# Patient Record
Sex: Female | Born: 1945
Health system: Southern US, Community
[De-identification: ages and names within clinical notes are randomized; demographics above are authoritative.]

## PROBLEM LIST (undated history)

## (undated) DIAGNOSIS — K317 Polyp of stomach and duodenum: Secondary | ICD-10-CM

## (undated) DIAGNOSIS — K5790 Diverticulosis of intestine, part unspecified, without perforation or abscess without bleeding: Secondary | ICD-10-CM

## (undated) DIAGNOSIS — J45909 Unspecified asthma, uncomplicated: Secondary | ICD-10-CM

## (undated) DIAGNOSIS — K222 Esophageal obstruction: Secondary | ICD-10-CM

## (undated) DIAGNOSIS — K219 Gastro-esophageal reflux disease without esophagitis: Secondary | ICD-10-CM

## (undated) DIAGNOSIS — K449 Diaphragmatic hernia without obstruction or gangrene: Secondary | ICD-10-CM

## (undated) HISTORY — DX: Unspecified asthma, uncomplicated: J45.909

## (undated) HISTORY — PX: LEFT OOPHORECTOMY: SHX1961

## (undated) HISTORY — DX: Diaphragmatic hernia without obstruction or gangrene: K44.9

## (undated) HISTORY — DX: Esophageal obstruction: K22.2

## (undated) HISTORY — DX: Polyp of stomach and duodenum: K31.7

## (undated) HISTORY — DX: Gastro-esophageal reflux disease without esophagitis: K21.9

## (undated) HISTORY — PX: APPENDECTOMY: SHX54

## (undated) HISTORY — PX: TUBAL LIGATION: SHX77

## (undated) HISTORY — PX: CHOLECYSTECTOMY: SHX55

## (undated) HISTORY — DX: Diverticulosis of intestine, part unspecified, without perforation or abscess without bleeding: K57.90

---

## 1998-02-25 ENCOUNTER — Ambulatory Visit (HOSPITAL_COMMUNITY): Admission: RE | Admit: 1998-02-25 | Discharge: 1998-02-25 | Payer: Self-pay | Admitting: *Deleted

## 1999-03-17 ENCOUNTER — Encounter: Payer: Self-pay | Admitting: *Deleted

## 1999-03-17 ENCOUNTER — Ambulatory Visit (HOSPITAL_COMMUNITY): Admission: RE | Admit: 1999-03-17 | Discharge: 1999-03-17 | Payer: Self-pay | Admitting: *Deleted

## 2001-03-06 ENCOUNTER — Encounter: Payer: Self-pay | Admitting: Internal Medicine

## 2001-04-09 ENCOUNTER — Encounter: Payer: Self-pay | Admitting: Internal Medicine

## 2001-10-08 ENCOUNTER — Encounter: Payer: Self-pay | Admitting: *Deleted

## 2001-10-08 ENCOUNTER — Ambulatory Visit (HOSPITAL_COMMUNITY): Admission: RE | Admit: 2001-10-08 | Discharge: 2001-10-08 | Payer: Self-pay | Admitting: *Deleted

## 2003-02-03 ENCOUNTER — Encounter: Payer: Self-pay | Admitting: *Deleted

## 2003-02-03 ENCOUNTER — Ambulatory Visit (HOSPITAL_COMMUNITY): Admission: RE | Admit: 2003-02-03 | Discharge: 2003-02-03 | Payer: Self-pay | Admitting: *Deleted

## 2005-05-17 ENCOUNTER — Ambulatory Visit: Payer: Self-pay | Admitting: Internal Medicine

## 2006-03-07 ENCOUNTER — Ambulatory Visit (HOSPITAL_COMMUNITY): Admission: RE | Admit: 2006-03-07 | Discharge: 2006-03-07 | Payer: Self-pay | Admitting: Internal Medicine

## 2006-09-27 ENCOUNTER — Ambulatory Visit: Payer: Self-pay | Admitting: Internal Medicine

## 2007-03-22 ENCOUNTER — Ambulatory Visit (HOSPITAL_COMMUNITY): Admission: RE | Admit: 2007-03-22 | Discharge: 2007-03-22 | Payer: Self-pay | Admitting: Internal Medicine

## 2008-03-24 ENCOUNTER — Ambulatory Visit (HOSPITAL_COMMUNITY): Admission: RE | Admit: 2008-03-24 | Discharge: 2008-03-24 | Payer: Self-pay | Admitting: Internal Medicine

## 2008-04-28 DIAGNOSIS — K449 Diaphragmatic hernia without obstruction or gangrene: Secondary | ICD-10-CM | POA: Insufficient documentation

## 2008-04-28 DIAGNOSIS — K219 Gastro-esophageal reflux disease without esophagitis: Secondary | ICD-10-CM | POA: Insufficient documentation

## 2008-04-28 DIAGNOSIS — K222 Esophageal obstruction: Secondary | ICD-10-CM | POA: Insufficient documentation

## 2008-04-28 DIAGNOSIS — K5730 Diverticulosis of large intestine without perforation or abscess without bleeding: Secondary | ICD-10-CM | POA: Insufficient documentation

## 2008-04-30 ENCOUNTER — Ambulatory Visit: Payer: Self-pay | Admitting: Internal Medicine

## 2008-05-13 ENCOUNTER — Telehealth: Payer: Self-pay | Admitting: Internal Medicine

## 2008-05-19 ENCOUNTER — Telehealth: Payer: Self-pay | Admitting: Internal Medicine

## 2008-05-20 ENCOUNTER — Encounter: Payer: Self-pay | Admitting: Internal Medicine

## 2009-03-26 ENCOUNTER — Ambulatory Visit (HOSPITAL_COMMUNITY): Admission: RE | Admit: 2009-03-26 | Discharge: 2009-03-26 | Payer: Self-pay | Admitting: Internal Medicine

## 2009-06-01 ENCOUNTER — Encounter: Payer: Self-pay | Admitting: Internal Medicine

## 2010-03-30 ENCOUNTER — Ambulatory Visit (HOSPITAL_COMMUNITY): Admission: RE | Admit: 2010-03-30 | Discharge: 2010-03-30 | Payer: Self-pay | Admitting: Internal Medicine

## 2010-10-27 ENCOUNTER — Other Ambulatory Visit: Payer: Self-pay | Admitting: Internal Medicine

## 2010-10-27 NOTE — Telephone Encounter (Signed)
See previous note

## 2010-10-27 NOTE — Telephone Encounter (Signed)
Called patient and she has made an appt. For April 19th at 11:15 to see Dr. Marina Goodell.  I will give her #30 until she comes in and she can then get a years worth at that time.

## 2010-11-11 ENCOUNTER — Encounter: Payer: Self-pay | Admitting: Internal Medicine

## 2010-11-11 ENCOUNTER — Ambulatory Visit (INDEPENDENT_AMBULATORY_CARE_PROVIDER_SITE_OTHER): Payer: Medicare Other | Admitting: Internal Medicine

## 2010-11-11 VITALS — BP 136/80 | HR 64 | Ht 61.0 in | Wt 163.4 lb

## 2010-11-11 DIAGNOSIS — K219 Gastro-esophageal reflux disease without esophagitis: Secondary | ICD-10-CM

## 2010-11-11 DIAGNOSIS — Z1211 Encounter for screening for malignant neoplasm of colon: Secondary | ICD-10-CM

## 2010-11-11 NOTE — Patient Instructions (Signed)
Follow-up with Dr. Marina Goodell 2 years Change recall colon to August 2012

## 2010-11-11 NOTE — Progress Notes (Signed)
HISTORY OF PRESENT ILLNESS:  Michelle Lane is a 65 y.o. female with the below listed medical history who is followed in this office for gastroesophageal reflux disease complicated by esophagitis and peptic stricture. She was last seen in October 2009 at which time she was doing well on PPI therapy. She previously indicated that her father had colon cancer for which the patient underwent screening colonoscopy in 2002. The exam was negative. She subsequently corrected the history stating that her father did not have colon cancer, but rather prostate cancer. In terms of GERD, she continues on omeprazole 40 mg daily. She is tolerating the medication well without apparent side effects. She denies breakthrough pyrosis or recurrent dysphagia. No lower GI complaints.  REVIEW OF SYSTEMS:  All non-GI ROS negative except for seasonal allergies.  Past Medical History  Diagnosis Date  . Diverticulosis   . Esophageal stricture   . Diaphragmatic hernia without mention of obstruction or gangrene   . Esophageal reflux     Past Surgical History  Procedure Date  . Appendectomy   . Cholecystectomy   . Tubal ligation   . Left oophorectomy     Social History DENALY GATLING  reports that she has quit smoking. She does not have any smokeless tobacco history on file. She reports that she drinks alcohol. She reports that she does not use illicit drugs.  family history includes Breast cancer in her mother; Diabetes in her brother; Heart disease in her brother; Kidney disease in her brother; and Prostate cancer in her father.  There is no history of Colon cancer.  No Known Allergies     PHYSICAL EXAMINATION: Vital signs: BP 136/80  Pulse 64  Ht 5\' 1"  (1.549 m)  Wt 163 lb 6.4 oz (74.118 kg)  BMI 30.87 kg/m2 General: Well-developed, well-nourished, no acute distress HEENT: Sclerae are anicteric, conjunctiva pink. Oral mucosa intact Lungs: Clear Heart: Regular Abdomen: soft, nontender, nondistended, no  obvious ascites, no peritoneal signs, normal bowel sounds. No organomegaly. Extremities: No edema Psychiatric: alert and oriented x3. Cooperative    ASSESSMENT:  #1. GERD complicated by erosive esophagitis and peptic stricture. The patient remains asymptomatic on PPI therapy. I discussed her issue surrounding long term PPI use. We focused on bone density issues. She tells me that she has had bone density testing which is normal.  #2. Colorectal neoplasia screening. Negative colonoscopy 2002. Up-to-date. Will be due for a repeat screening around August 2012. She is aware. Recall made.   PLAN:  #1. Refill omeprazole 40 mg daily #2. Reflux precautions #3. Screening colonoscopy later this year. #4. Routine office followup in 2 years. Sooner for questions or problems.

## 2010-12-10 NOTE — Assessment & Plan Note (Signed)
[facility]                         GASTROENTEROLOGY OFFICE NOTE   NAME:Jumon[place], NALDA SHACKLEFORD                        MRN:          [id number]  DATE:09/27/2006                            DOB:          Dec 02, 1945    HISTORY:  Afifa presents today for followup.  She is a 65 year old with  a history of gastroesophageal reflux disease, complicated by peptic  stricture.  She also has a family history of colon cancer with her index  screening colonoscopy in August of 2002 showing diverticulosis only.  She presents now for routine followup.  Her last visit was May 17, 2005.  See that dictation for details.  Since that time she remains  asymptomatic on Nexium 40 mg daily.  No problems with heartburn,  indigestion, or dysphagia.  She is aware that she is somewhat overdue  for her followup screening colonoscopy.   CURRENT MEDICATIONS:  Nexium, vitamin E, and Macrobid.   PHYSICAL EXAMINATION:  Finds a well-appearing female in no acute  distress.  Blood pressure is 108/70, heart rate is 80 and regular,  weight is 136 pounds.  ABDOMEN:  Soft without tenderness, mass, or hernia.  Good bowel sounds  heard.   IMPRESSION:  1. Gastroesophageal reflux disease, complicated by erosive esophagitis      and peptic stricture.  Currently asymptomatic on Nexium.  2. Family history of colon cancer in a first degree relative with      negative screening colonoscopy in August of 2002.  Due for followup      screening.  3. Incidental diverticulosis.   RECOMMENDATIONS:  1. Continue Nexium 40 mg daily.  A prescription with multiple refills      has been provided.  2. Continue adherence to reflux precautions.  3. Screening colonoscopy, postponed until this summer per the      patient's wishes.  I have provided her with my professional card      and my nurse's name and phone number to assist her with scheduling.     Wilhemina Bonito. Marina Goodell, MD  Electronically Signed    JNP/MedQ  DD:  09/27/2006  DT: 09/27/2006  Job #: [id number]   cc:   Geoffry Paradise, M.D.

## 2011-03-01 ENCOUNTER — Other Ambulatory Visit (HOSPITAL_COMMUNITY): Payer: Self-pay | Admitting: Internal Medicine

## 2011-03-01 DIAGNOSIS — Z1231 Encounter for screening mammogram for malignant neoplasm of breast: Secondary | ICD-10-CM

## 2011-04-04 ENCOUNTER — Ambulatory Visit (HOSPITAL_COMMUNITY)
Admission: RE | Admit: 2011-04-04 | Discharge: 2011-04-04 | Disposition: A | Discharge status: Home / Self Care | Payer: Medicare Other | Source: Ambulatory Visit | Attending: Internal Medicine | Admitting: Internal Medicine

## 2011-04-04 DIAGNOSIS — Z1231 Encounter for screening mammogram for malignant neoplasm of breast: Secondary | ICD-10-CM

## 2011-04-23 ENCOUNTER — Other Ambulatory Visit: Payer: Self-pay | Admitting: Internal Medicine

## 2011-10-08 ENCOUNTER — Other Ambulatory Visit: Payer: Self-pay | Admitting: Internal Medicine

## 2011-10-12 ENCOUNTER — Other Ambulatory Visit: Payer: Self-pay

## 2011-10-14 ENCOUNTER — Telehealth: Payer: Self-pay

## 2011-10-14 NOTE — Telephone Encounter (Signed)
Pharmacist did not receive rx for Omeprazole when sent electronically; gave pharmacist info over phone for refill

## 2012-02-28 ENCOUNTER — Other Ambulatory Visit (HOSPITAL_COMMUNITY): Payer: Self-pay | Admitting: Internal Medicine

## 2012-02-28 DIAGNOSIS — Z1231 Encounter for screening mammogram for malignant neoplasm of breast: Secondary | ICD-10-CM

## 2012-04-04 ENCOUNTER — Ambulatory Visit (HOSPITAL_COMMUNITY): Payer: Medicare Other

## 2012-04-18 ENCOUNTER — Ambulatory Visit (HOSPITAL_COMMUNITY)
Admission: RE | Admit: 2012-04-18 | Discharge: 2012-04-18 | Disposition: A | Discharge status: Home / Self Care | Payer: Medicare Other | Source: Ambulatory Visit | Attending: Internal Medicine | Admitting: Internal Medicine

## 2012-04-18 DIAGNOSIS — Z1231 Encounter for screening mammogram for malignant neoplasm of breast: Secondary | ICD-10-CM | POA: Insufficient documentation

## 2012-11-05 ENCOUNTER — Other Ambulatory Visit: Payer: Self-pay | Admitting: Internal Medicine

## 2013-02-19 ENCOUNTER — Other Ambulatory Visit: Payer: Self-pay | Admitting: Internal Medicine

## 2013-04-30 ENCOUNTER — Other Ambulatory Visit (HOSPITAL_COMMUNITY): Payer: Self-pay | Admitting: Internal Medicine

## 2013-04-30 DIAGNOSIS — Z1231 Encounter for screening mammogram for malignant neoplasm of breast: Secondary | ICD-10-CM

## 2013-05-10 ENCOUNTER — Ambulatory Visit (HOSPITAL_COMMUNITY)
Admission: RE | Admit: 2013-05-10 | Discharge: 2013-05-10 | Disposition: A | Discharge status: Home / Self Care | Payer: Medicare Other | Source: Ambulatory Visit | Attending: Internal Medicine | Admitting: Internal Medicine

## 2013-05-10 DIAGNOSIS — Z1231 Encounter for screening mammogram for malignant neoplasm of breast: Secondary | ICD-10-CM

## 2013-07-09 ENCOUNTER — Ambulatory Visit (INDEPENDENT_AMBULATORY_CARE_PROVIDER_SITE_OTHER): Payer: Medicare Other | Admitting: Gastroenterology

## 2013-07-09 ENCOUNTER — Encounter: Payer: Self-pay | Admitting: Gastroenterology

## 2013-07-09 VITALS — BP 128/80 | HR 72 | Ht 60.0 in | Wt 129.3 lb

## 2013-07-09 DIAGNOSIS — Z791 Long term (current) use of non-steroidal anti-inflammatories (NSAID): Secondary | ICD-10-CM | POA: Insufficient documentation

## 2013-07-09 DIAGNOSIS — K5900 Constipation, unspecified: Secondary | ICD-10-CM

## 2013-07-09 DIAGNOSIS — K219 Gastro-esophageal reflux disease without esophagitis: Secondary | ICD-10-CM

## 2013-07-09 DIAGNOSIS — K222 Esophageal obstruction: Secondary | ICD-10-CM

## 2013-07-09 DIAGNOSIS — R110 Nausea: Secondary | ICD-10-CM

## 2013-07-09 DIAGNOSIS — R6881 Early satiety: Secondary | ICD-10-CM

## 2013-07-09 MED ORDER — OMEPRAZOLE 40 MG PO CPDR: 40.0000 mg | DELAYED_RELEASE_CAPSULE | Freq: Two times a day (BID) | ORAL | Status: DC

## 2013-07-09 NOTE — Progress Notes (Signed)
07/09/2013 Michelle Lane [id number] 14-Jan-1946   History of Present Illness:  This is a pleasant 67 year old female who is known to Dr. Marina Goodell for her history of reflux issues. She was last seen in April 2012 at which time her reflux seemed to be well controlled on daily PPI therapy. She presents to our office today stating that for quite some time now she has been having extreme nausea and feels very full even after eating small amounts. She states that she only eats one meal per day and often gets very sick after eating. She says that sometimes it even seems like she gets chills and other flulike symptoms after eating. She cannot identify any particular foods that cause her symptoms. She takes omeprazole 40 mg daily and says that it controls her heartburn and reflux symptoms. She is concerned that she has ulcers or gastric cancer. She was using a lot of ibuprofen approximately 8-12 per day for several years until visiting her PCP in October when they told her to discontinue this. She uses Tylenol now and has only taken about 12 ibuprofen total since October. She had an EGD in  2002 at which time she was found to have reflux esophagitis, hiatal hernia, esophageal stricture, GERD, and duodenitis.    She also complains of some constipation.  She has taken a laxative on occasion but says that makes her stomach hurt and makes her feel sick. Otherwise she has not used anything to help her move her bowels. Her last colonoscopy was also in 2002 and she was advised that she was due in 2012. She told me that she did not want to have another colonoscopy at this time and was not having any other lower GI symptoms or complaints.  Recent CBC, BMP, and TSH by her PCP were normal.   Current Medications, Allergies, Past Medical History, Past Surgical History, Family History and Social History were reviewed in [employer] record.   Physical Exam: BP 128/80  Pulse 72  Ht 5' (1.524 m)  Wt 129 lb  4.8 oz (58.65 kg)  BMI 25.25 kg/m2 General: Well developed, white female in no acute distress Head: Normocephalic and atraumatic Eyes:  Sclerae anicteric, conjunctiva pink  Ears: Normal auditory acuity Lungs: Clear throughout to auscultation Heart: Regular rate and rhythm Abdomen: Soft, non-distended.  Normal bowel sounds.  Non-tender. Musculoskeletal: Symmetrical with no gross deformities  Extremities: No edema  Neurological: Alert oriented x 4, grossly nonfocal Psychological:  Alert and cooperative. Normal mood and affect  Assessment and Recommendations: -Nausea and early satiety:  Reflux disease versus ulcer disease versus gastroparesis. -History of NSAID use:  He was taking 8-12 ibuprofen daily for several years until October. Has only taken 12 pills total since October. -History of GERD and esophageal stricture:  Seen on EGD in 2002. -Constipation  *We will schedule for EGD for further evaluation.  In the meantime she will increase her omeprazole to 40 mg twice daily. If EGD is negative then may want to consider gastric emptying scan. *She will begin taking MiraLax once daily. *She was due for screening colonoscopy in 2012 and I did offer to schedule that for her today as well, however, she declined and stated that she did not want to have another colonoscopy at this time.  The risks, benefits, and alternatives were discussed with the patient and she consents to proceed.

## 2013-07-09 NOTE — Patient Instructions (Signed)
Increase the Omeprazole 40 mg to twice daily.  You have been scheduled for an endoscopy with propofol. Please follow written instructions given to you at your visit today. If you use inhalers (even only as needed), please bring them with you on the day of your procedure.

## 2013-07-09 NOTE — Progress Notes (Signed)
Agree with initial assessment and plans. EGD to evaluate upper GI symptoms. Overdue for colonoscopy. Family history of colon cancer.

## 2013-07-11 ENCOUNTER — Ambulatory Visit (AMBULATORY_SURGERY_CENTER): Payer: Medicare Other | Admitting: Internal Medicine

## 2013-07-11 ENCOUNTER — Telehealth: Payer: Self-pay

## 2013-07-11 ENCOUNTER — Encounter: Payer: Self-pay | Admitting: Internal Medicine

## 2013-07-11 ENCOUNTER — Other Ambulatory Visit: Payer: Self-pay

## 2013-07-11 VITALS — BP 127/55 | HR 63 | Temp 95.9°F | Resp 15 | Ht 60.0 in | Wt 129.0 lb

## 2013-07-11 DIAGNOSIS — R6881 Early satiety: Secondary | ICD-10-CM

## 2013-07-11 DIAGNOSIS — R109 Unspecified abdominal pain: Secondary | ICD-10-CM

## 2013-07-11 DIAGNOSIS — K219 Gastro-esophageal reflux disease without esophagitis: Secondary | ICD-10-CM

## 2013-07-11 DIAGNOSIS — R110 Nausea: Secondary | ICD-10-CM

## 2013-07-11 MED ORDER — SODIUM CHLORIDE 0.9 % IV SOLN: 500.0000 mL | INTRAVENOUS | Status: DC

## 2013-07-11 NOTE — Patient Instructions (Signed)
Discharge instructions given with verbal understanding. Normal exam. Resume previous medications. YOU HAD AN ENDOSCOPIC PROCEDURE TODAY AT THE [facility]: Refer to the procedure report that was given to you for any specific questions about what was found during the examination.  If the procedure report does not answer your questions, please call your gastroenterologist to clarify.  If you requested that your care partner not be given the details of your procedure findings, then the procedure report has been included in a sealed envelope for you to review at your convenience later.  YOU SHOULD EXPECT: Some feelings of bloating in the abdomen. Passage of more gas than usual.  Walking can help get rid of the air that was put into your GI tract during the procedure and reduce the bloating. If you had a lower endoscopy (such as a colonoscopy or flexible sigmoidoscopy) you may notice spotting of blood in your stool or on the toilet paper. If you underwent a bowel prep for your procedure, then you may not have a normal bowel movement for a few days.  DIET: Your first meal following the procedure should be a light meal and then it is ok to progress to your normal diet.  A half-sandwich or bowl of soup is an example of a good first meal.  Heavy or fried foods are harder to digest and may make you feel nauseous or bloated.  Likewise meals heavy in dairy and vegetables can cause extra gas to form and this can also increase the bloating.  Drink plenty of fluids but you should avoid alcoholic beverages for 24 hours.  ACTIVITY: Your care partner should take you home directly after the procedure.  You should plan to take it easy, moving slowly for the rest of the day.  You can resume normal activity the day after the procedure however you should NOT DRIVE or use heavy machinery for 24 hours (because of the sedation medicines used during the test).    SYMPTOMS TO REPORT IMMEDIATELY: A gastroenterologist  can be reached at any hour.  During normal business hours, 8:30 AM to 5:00 PM Monday through Friday, call [phone].  After hours and on weekends, please call the GI answering service at [phone] who will take a message and have the physician on call contact you.   Following upper endoscopy (EGD)  Vomiting of blood or coffee ground material  New chest pain or pain under the shoulder blades  Painful or persistently difficult swallowing  New shortness of breath  Fever of 100F or higher  Black, tarry-looking stools  FOLLOW UP: If any biopsies were taken you will be contacted by phone or by letter within the next 1-3 weeks.  Call your gastroenterologist if you have not heard about the biopsies in 3 weeks.  Our staff will call the home number listed on your records the next business day following your procedure to check on you and address any questions or concerns that you may have at that time regarding the information given to you following your procedure. This is a courtesy call and so if there is no answer at the home number and we have not heard from you through the emergency physician on call, we will assume that you have returned to your regular daily activities without incident.  SIGNATURES/CONFIDENTIALITY: You and/or your care partner have signed paperwork which will be entered into your electronic medical record.  These signatures attest to the fact that that the information above on your After   Visit Summary has been reviewed and is understood.  Full responsibility of the confidentiality of this discharge information lies with you and/or your care-partner. 

## 2013-07-11 NOTE — Progress Notes (Signed)
Patient did not experience any of the following events: a burn prior to discharge; a fall within the facility; wrong site/side/patient/procedure/implant event; or a hospital transfer or hospital admission upon discharge from the facility. (G8907) Patient did not have preoperative order for IV antibiotic SSI prophylaxis. (G8918)  

## 2013-07-11 NOTE — Op Note (Signed)
[facility] [address] [place], [address]   ENDOSCOPY PROCEDURE REPORT  PATIENT: Michelle, Lane  MR#: [id number] BIRTHDATE: 02/12/1946 , 67  yrs. old GENDER: Female ENDOSCOPIST: Roxy Cedar, MD REFERRED BY:  .  Self / Office PROCEDURE DATE:  07/11/2013 PROCEDURE:  EGD, diagnostic ASA CLASS:     Class II INDICATIONS:  Nausea. Early satiety;  Weight loss. MEDICATIONS: MAC sedation, administered by CRNA and propofol (Diprivan) 150mg  IV TOPICAL ANESTHETIC: Cetacaine Spray  DESCRIPTION OF PROCEDURE: After the risks benefits and alternatives of the procedure were thoroughly explained, informed consent was obtained.  The LB [id number] endoscope was introduced through the mouth and advanced to the second portion of the duodenum. Without limitations.  The instrument was slowly withdrawn as the mucosa was fully examined.      EXAM: The upper, middle and distal third of the esophagus were carefully inspected and no abnormalities were noted.  The z-line was well seen at the GEJ.  The endoscope was pushed into the fundus which was normal including a retroflexed view.  The antrum, gastric body, first and second part of the duodenum were unremarkable, except for incidental small fundic gland type polyps.  Retroflexed views revealed a hiatal hernia.     The scope was then withdrawn from the patient and the procedure completed.  COMPLICATIONS: There were no complications. ENDOSCOPIC IMPRESSION: 1. Normal EGD 2. GERD  RECOMMENDATIONS: 1.  Continue PPI 2.  My office will arrange for you to have a Solid Phase Gastric Emptying Scan performed.  This is a radiology test that gives an idea of how well your stomach empties.  REPEAT EXAM:  eSigned:  Roxy Cedar, MD 07/11/2013 2:36 PM   [id number] Jacky Kindle, MD and The Patient

## 2013-07-11 NOTE — Telephone Encounter (Signed)
Pt scheduled for GES at [facility] 08/05/13. Pt to arrive there at 7:15am for a 7:30am appt. Pt to hold prilosec for 24 hours prior to exam. Pt aware.

## 2013-07-12 ENCOUNTER — Telehealth: Payer: Self-pay

## 2013-07-12 NOTE — Telephone Encounter (Signed)
  Follow up Call-  Call back number 07/11/2013  Post procedure Call Back phone  # [phone]  Permission to leave phone message Yes     Patient questions:  Do you have a fever, pain , or abdominal swelling? no Pain Score  0 *  Have you tolerated food without any problems? yes  Have you been able to return to your normal activities? yes  Do you have any questions about your discharge instructions: Diet   no Medications  no Follow up visit  no  Do you have questions or concerns about your Care? no  Actions: * If pain score is 4 or above: No action needed, pain <4.

## 2013-08-05 ENCOUNTER — Ambulatory Visit (HOSPITAL_COMMUNITY): Payer: Medicare Other

## 2014-01-27 ENCOUNTER — Telehealth: Payer: Self-pay | Admitting: Internal Medicine

## 2014-01-27 MED ORDER — OMEPRAZOLE 40 MG PO CPDR: 40.0000 mg | DELAYED_RELEASE_CAPSULE | Freq: Two times a day (BID) | ORAL | Status: DC

## 2014-01-27 NOTE — Telephone Encounter (Signed)
Refilled Prilosec

## 2014-01-28 ENCOUNTER — Telehealth: Payer: Self-pay

## 2014-01-28 NOTE — Telephone Encounter (Signed)
Sent prior authorization for Omeprazole through Cover My Meds

## 2014-03-24 ENCOUNTER — Encounter: Payer: Self-pay | Admitting: Internal Medicine

## 2014-04-14 ENCOUNTER — Other Ambulatory Visit (HOSPITAL_COMMUNITY): Payer: Self-pay | Admitting: Internal Medicine

## 2014-04-14 DIAGNOSIS — Z1231 Encounter for screening mammogram for malignant neoplasm of breast: Secondary | ICD-10-CM

## 2014-05-15 ENCOUNTER — Ambulatory Visit (HOSPITAL_COMMUNITY)
Admission: RE | Admit: 2014-05-15 | Discharge: 2014-05-15 | Disposition: A | Discharge status: Home / Self Care | Payer: Medicare Other | Source: Ambulatory Visit | Attending: Internal Medicine | Admitting: Internal Medicine

## 2014-05-15 DIAGNOSIS — Z1231 Encounter for screening mammogram for malignant neoplasm of breast: Secondary | ICD-10-CM | POA: Insufficient documentation

## 2014-05-19 ENCOUNTER — Other Ambulatory Visit: Payer: Self-pay | Admitting: Internal Medicine

## 2014-06-05 ENCOUNTER — Encounter: Payer: Self-pay | Admitting: Neurology

## 2014-06-05 ENCOUNTER — Ambulatory Visit (INDEPENDENT_AMBULATORY_CARE_PROVIDER_SITE_OTHER): Payer: Medicare Other | Admitting: Neurology

## 2014-06-05 VITALS — BP 152/81 | HR 65 | Ht 60.0 in | Wt 127.2 lb

## 2014-06-05 DIAGNOSIS — R200 Anesthesia of skin: Secondary | ICD-10-CM

## 2014-06-05 NOTE — Progress Notes (Signed)
Reason for visit: numbness  Michelle Lane is a 68 y.o. female  History of present illness:  Michelle Lane is a 68 year old right-handed white female with a history of numbness and tingling sensations and cold sensations in the fingers of the hands that has been present for least 2 years. The patient believes that the symptoms have gradually worsened over time, and are unassociated with balance issues, or weakness. The patient does not note any numbness on the body or the head or face. She denies any issues with neck pain or low back pain or pain radiating down the arms or legs. She denies any difficulty controlling the bowels or the bladder. She denies any family history of similar symptoms. The patient indicates that the symptoms are worse when she is inactive, and are less noticeable when she is up doing things during the day. The patient denies any significant issues with sleeping at night because of the sensory complaints. She denies any restless legs type symptoms. She is sent to this office for further evaluation. A recent vitamin B12 level was apparently normal.   Past Medical History  Diagnosis Date  . Diverticulosis   . Esophageal stricture   . Diaphragmatic hernia without mention of obstruction or gangrene   . Esophageal reflux     Past Surgical History  Procedure Laterality Date  . Appendectomy    . Cholecystectomy    . Tubal ligation    . Left oophorectomy      Family History  Problem Relation Age of Onset  . Breast cancer Mother   . Prostate cancer Father   . Diabetes Brother     x 3   . Heart disease Brother     x 2  . Kidney disease Brother   . Colon cancer Neg Hx     Social history:  reports that she has quit smoking. She has never used smokeless tobacco. She reports that she drinks alcohol. She reports that she does not use illicit drugs.  Medications:  Current Outpatient Prescriptions on File Prior to Visit  Medication Sig Dispense Refill  . omeprazole  (PRILOSEC) 40 MG capsule take 1 capsule by mouth twice a day 60 capsule 3  . VITAMIN D, ERGOCALCIFEROL, PO Take 1 capsule by mouth daily.      Marland Kitchen. zolpidem (AMBIEN) 10 MG tablet Take 10 mg by mouth at bedtime.      No current facility-administered medications on file prior to visit.     No Known Allergies  ROS:  Out of a complete 14 system review of symptoms, the patient complains only of the following symptoms, and all other reviewed systems are negative.  Ringing in the ears Itching Wheezing Allergies Numbness, dizziness  Blood pressure 152/81, pulse 65, height 5' (1.524 m), weight 127 lb 3.2 oz (57.698 kg).  Physical Exam  General: The patient is alert and cooperative at the time of the examination.  Eyes: Pupils are equal, round, and reactive to light. Discs are flat bilaterally.  Neck: The neck is supple, no carotid bruits are noted.  Respiratory: The respiratory examination is clear.  Cardiovascular: The cardiovascular examination reveals a regular rate and rhythm, no obvious murmurs or rubs are noted.  Skin: Extremities are without significant edema.  Neurologic Exam  Mental status: The patient is alert and oriented x 3 at the time of the examination. The patient has apparent normal recent and remote memory, with an apparently normal attention span and concentration ability.  Cranial nerves:  Facial symmetry is present. There is good sensation of the face to pinprick and soft touch bilaterally. The strength of the facial muscles and the muscles to head turning and shoulder shrug are normal bilaterally. Speech is well enunciated, no aphasia or dysarthria is noted. Extraocular movements are full. Visual fields are full. The tongue is midline, and the patient has symmetric elevation of the soft palate. No obvious hearing deficits are noted.  Motor: The motor testing reveals 5 over 5 strength of all 4 extremities. Good symmetric motor tone is noted throughout.  Sensory:  Sensory testing is intact to pinprick, soft touch, vibration sensation, and position sense on all 4 extremities. No evidence of extinction is noted.  Coordination: Cerebellar testing reveals good finger-nose-finger and heel-to-shin bilaterally.  Gait and station: Gait is normal. Tandem gait is normal. Romberg is negative. No drift is seen.  Reflexes: Deep tendon reflexes are symmetric and normal bilaterally. Toes are downgoing bilaterally.   Assessment/Plan:  1. Sensory paresthesias all 4 extremities  The patient does report symptoms that could be consistent with a peripheral neuropathy. Clinical examination shows no definite stocking pattern pinprick sensory deficit, with well-maintained reflexes on all 4 extremities. The patient will be set up for nerve conduction studies on both legs and one arm, EMG evaluation on one leg. Blood work will be done today. If these evaluations are completely normal, we may consider MRI evaluation of the cervical spine to exclude a low-grade spinal cord compression. The patient will follow-up for EMG evaluation.  Marlan Palau. Keith Everest Hacking MD 06/05/2014 7:53 PM  [facility] [address] [place], [place] [id number]  Phone [phone] Fax [phone]

## 2014-06-05 NOTE — Patient Instructions (Signed)

## 2014-06-10 LAB — IFE AND PE, SERUM
ALBUMIN SERPL ELPH-MCNC: 4 g/dL (ref 3.2–5.6)
ALBUMIN/GLOB SERPL: 1.3 (ref 0.7–2.0)
Alpha 1: 0.2 g/dL (ref 0.1–0.4)
Alpha2 Glob SerPl Elph-Mcnc: 0.7 g/dL (ref 0.4–1.2)
B-Globulin SerPl Elph-Mcnc: 1 g/dL (ref 0.6–1.3)
Gamma Glob SerPl Elph-Mcnc: 1.2 g/dL (ref 0.5–1.6)
Globulin, Total: 3.1 g/dL (ref 2.0–4.5)
IGA/IMMUNOGLOBULIN A, SERUM: 156 mg/dL (ref 91–414)
IGG (IMMUNOGLOBIN G), SERUM: 1278 mg/dL (ref 700–1600)
IGM (IMMUNOGLOBULIN M), SRM: 61 mg/dL (ref 40–230)
TOTAL PROTEIN: 7.1 g/dL (ref 6.0–8.5)

## 2014-06-10 LAB — RHEUMATOID FACTOR: Rhuematoid fact SerPl-aCnc: 10.5 IU/mL (ref 0.0–13.9)

## 2014-06-10 LAB — COPPER, SERUM: COPPER: 118 ug/dL (ref 72–166)

## 2014-06-10 LAB — ANGIOTENSIN CONVERTING ENZYME: Angio Convert Enzyme: 54 U/L (ref 14–82)

## 2014-06-10 LAB — ANA W/REFLEX: ANA: NEGATIVE

## 2014-06-11 ENCOUNTER — Encounter: Payer: Self-pay | Admitting: Neurology

## 2014-06-12 ENCOUNTER — Encounter (INDEPENDENT_AMBULATORY_CARE_PROVIDER_SITE_OTHER): Payer: Self-pay

## 2014-06-12 ENCOUNTER — Ambulatory Visit (INDEPENDENT_AMBULATORY_CARE_PROVIDER_SITE_OTHER): Payer: Medicare Other | Admitting: Neurology

## 2014-06-12 DIAGNOSIS — Z0289 Encounter for other administrative examinations: Secondary | ICD-10-CM

## 2014-06-12 DIAGNOSIS — R200 Anesthesia of skin: Secondary | ICD-10-CM

## 2014-06-12 NOTE — Progress Notes (Signed)
Michelle Lane is a 68 year old patient with a history of paresthesias affecting arms and legs. The patient comes in for EMG and nerve conduction study.  Nerve conduction studies on the right arm and both legs were unremarkable. A small fiber neuropathy cannot be excluded. EMG of the right leg is unremarkable.  The patient will be set up for MRI evaluation of the cervical spine. If this is unremarkable, the diagnosis of a small fiber neuropathy will be entertained.  The patient follow-up in 3 or 4 months.

## 2014-06-12 NOTE — Procedures (Signed)
     HISTORY:  Michelle Lane is a 68 year old patient with a history of onset of paresthesias and numbness affecting the hands and feet. The patient has not had any balance issues or weakness. She is being evaluated for possible peripheral neuropathy.  NERVE CONDUCTION STUDIES:  Nerve conduction studies were performed on the right upper extremity. The distal motor latencies and motor amplitudes for the median and ulnar nerves were within normal limits. The F wave latencies and nerve conduction velocities for these nerves were also normal. The sensory latencies for the median and ulnar nerves were normal.  Nerve conduction studies were performed on both lower extremities. The distal motor latencies and motor amplitudes for the peroneal and posterior tibial nerves were within normal limits. The nerve conduction velocities for these nerves were also normal. The H reflex latencies were normal. The sensory latencies for the peroneal nerves were within normal limits.   EMG STUDIES:  EMG study was performed on the right lower extremity:  The tibialis anterior muscle reveals 2 to 4K motor units with full recruitment. No fibrillations or positive waves were seen. The peroneus tertius muscle reveals 2 to 4K motor units with full recruitment. No fibrillations or positive waves were seen. The medial gastrocnemius muscle reveals 1 to 3K motor units with full recruitment. No fibrillations or positive waves were seen. The vastus lateralis muscle reveals 2 to 4K motor units with full recruitment. No fibrillations or positive waves were seen. The iliopsoas muscle reveals 2 to 4K motor units with full recruitment. No fibrillations or positive waves were seen. The biceps femoris muscle (long head) reveals 2 to 4K motor units with full recruitment. No fibrillations or positive waves were seen. The lumbosacral paraspinal muscles were tested at 3 levels, and revealed no abnormalities of insertional activity at all 3  levels tested. There was good relaxation.   IMPRESSION:  Nerve conduction studies done on the right upper extremity and both lower extremities were within normal limits. No evidence of a peripheral neuropathy is seen. A small fiber neuropathy cannot be excluded on this evaluation. Clinical correlation is required. EMG evaluation of the right lower extremity is unremarkable, without evidence of an overlying lumbosacral radiculopathy.  Michelle Lane. Keith Dacia Capers MD 06/12/2014 1:46 PM  [facility] [address] [place] [id number]  Phone [phone] Fax [phone]

## 2014-06-17 ENCOUNTER — Encounter: Payer: Self-pay | Admitting: Neurology

## 2014-08-04 ENCOUNTER — Telehealth: Payer: Self-pay | Admitting: Internal Medicine

## 2014-08-04 NOTE — Telephone Encounter (Signed)
Left message for pt to call back  °

## 2014-08-04 NOTE — Telephone Encounter (Signed)
Pt states she is having problems with abdominal pain and nausea. Pt would like to reschedule the tests she was supposed to have. Pt states it has been a while since she was seen and that she may need to schedule an OV. Pt scheduled to see Dr. Marina GoodellPerry 09/03/14@10 :45am. Pt aware of appt.

## 2014-09-03 ENCOUNTER — Ambulatory Visit: Payer: Self-pay | Admitting: Internal Medicine

## 2014-09-12 ENCOUNTER — Other Ambulatory Visit: Payer: Self-pay | Admitting: Internal Medicine

## 2015-01-04 ENCOUNTER — Other Ambulatory Visit: Payer: Self-pay | Admitting: Internal Medicine

## 2015-04-20 ENCOUNTER — Other Ambulatory Visit: Payer: Self-pay

## 2015-04-20 DIAGNOSIS — Z1231 Encounter for screening mammogram for malignant neoplasm of breast: Secondary | ICD-10-CM

## 2015-05-20 ENCOUNTER — Ambulatory Visit
Admission: RE | Admit: 2015-05-20 | Discharge: 2015-05-20 | Disposition: A | Discharge status: Home / Self Care | Payer: Medicare Other | Source: Ambulatory Visit

## 2015-05-20 DIAGNOSIS — Z1231 Encounter for screening mammogram for malignant neoplasm of breast: Secondary | ICD-10-CM

## 2015-05-25 ENCOUNTER — Other Ambulatory Visit (INDEPENDENT_AMBULATORY_CARE_PROVIDER_SITE_OTHER): Payer: Medicare Other

## 2015-05-25 ENCOUNTER — Ambulatory Visit (INDEPENDENT_AMBULATORY_CARE_PROVIDER_SITE_OTHER): Payer: Medicare Other | Admitting: Internal Medicine

## 2015-05-25 ENCOUNTER — Encounter: Payer: Self-pay | Admitting: Internal Medicine

## 2015-05-25 VITALS — BP 144/74 | HR 64 | Ht 60.0 in | Wt 121.6 lb

## 2015-05-25 DIAGNOSIS — R1084 Generalized abdominal pain: Secondary | ICD-10-CM

## 2015-05-25 DIAGNOSIS — Z1211 Encounter for screening for malignant neoplasm of colon: Secondary | ICD-10-CM

## 2015-05-25 DIAGNOSIS — R140 Abdominal distension (gaseous): Secondary | ICD-10-CM

## 2015-05-25 DIAGNOSIS — K219 Gastro-esophageal reflux disease without esophagitis: Secondary | ICD-10-CM

## 2015-05-25 LAB — IGA: IgA: 148 mg/dL (ref 68–378)

## 2015-05-25 MED ORDER — ALIGN PO CAPS: 1.0000 | ORAL_CAPSULE | Freq: Every day | ORAL | Status: DC

## 2015-05-25 NOTE — Patient Instructions (Signed)
You may take the probiotic Align once a day for a couple of weeks to see if this helps. This puts good bacteria back into your colon. You should take 1 capsule by mouth once daily. If this works well for you, it can be purchased over the counter.   Your physician has requested that you go to the basement for the following lab work before leaving today:  TTG, iGA

## 2015-05-25 NOTE — Progress Notes (Signed)
HISTORY OF PRESENT ILLNESS:  Michelle Lane is a [age] y.o. female who is followed in this office for GERD. She presents today with a chief complaint of increased intestinal gas as manifested by postprandial bloating. The patient was last evaluated December 2014 for nausea, early satiety, and weight loss. Upper endoscopy at that time was normal. She was continued on PPI for GERD. Solid-phase gastric emptying scan was recommended but the patient did not follow through. At time of her last office evaluation repeat screening colonoscopy is recommended. She declined. Last examination August 2002 revealed diverticulosis. She reported family history of colon cancer in her parents at 8270. However, denies a family history of colon cancer today. In any event, she reports 3-4 year history of problems with postprandial bloating. Describes this is generalized and occasional "stomachache". Bloating generally occurs one half an hour after her meals. She states that she self-imposed dairy free diet which seemed to help. As well avoidance of raw vegetables which seemed to help. She reports having a bowel movement once every 2-3 days which is unchanged. She denies rectal bleeding or change in weight. She does continue on PPI therapy in the form of omeprazole 40 mg for GERD. She denies active GERD symptoms or dysphagia.  REVIEW OF SYSTEMS:  All non-GI ROS negative upon comprehensive review  Past Medical History  Diagnosis Date  . Diverticulosis   . Esophageal stricture   . Diaphragmatic hernia without mention of obstruction or gangrene   . Esophageal reflux     Past Surgical History  Procedure Laterality Date  . Appendectomy    . Cholecystectomy    . Tubal ligation    . Left oophorectomy      Social History Michelle PolioKaren R Machnik  reports that she has quit smoking. She has never used smokeless tobacco. She reports that she drinks alcohol. She reports that she does not use illicit drugs.  family history includes Breast  cancer in her mother; Diabetes in her brother; Heart disease in her brother; Kidney disease in her brother; Prostate cancer in her father. There is no history of Colon cancer.  No Known Allergies     PHYSICAL EXAMINATION: Vital signs: BP 144/74 mmHg  Pulse 64  Ht 5' (1.524 m)  Wt 121 lb 9.6 oz (55.157 kg)  BMI 23.75 kg/m2  Constitutional: generally well-appearing, no acute distress Psychiatric: alert and oriented x3, cooperative Eyes: extraocular movements intact, anicteric, conjunctiva pink Mouth: oral pharynx moist, no lesions Neck: supple no lymphadenopathy Cardiovascular: heart regular rate and rhythm, no murmur Lungs: clear to auscultation bilaterally Abdomen: soft, nontender, nondistended, no obvious ascites, no peritoneal signs, normal bowel sounds, no organomegaly Rectal: Omitted Extremities: no clubbing cyanosis or lower extremity edema bilaterally Skin: no lesions on visible extremities Neuro: No focal deficits.  ASSESSMENT:  #1. Chronic postprandial bloating. No alarm features #2. Chronic GERD. Symptoms controlled with omeprazole. Endoscopy last year as described #3. Colon cancer screening. Index examination 2002 with diverticulosis. The patient again declines to proceed with repeat colon cancer screening. In addition to optical colonoscopy I did discuss with her Cologuard. She understands, but will not commit  PLAN:  #1. Reflux precautions #2. Continue PPI for GERD. Refilled as needed #3. Educational discussion on intestinal gas. As well, literature provided on intestinal gas as well as anti-gas and flatulence dietary sheet #4. Screen for celiac disease with tissue transglutaminase antibody and serum IgA level #5. Recommended empiric trial of probiotic align. One daily for 2 weeks. If helpful, may use on  demand #6. Contact the office if she changes her mind regarding colon cancer screening strategies #7. Interval GI follow-up as needed  25 minutes was spent  face-to-face with the patient. Greater than 50% the time use for counseling her regarding her GI conditions and GI complaints as well as potential strategies

## 2015-05-26 LAB — TISSUE TRANSGLUTAMINASE, IGA: Tissue Transglutaminase Ab, IgA: 1 U/mL (ref <4)

## 2015-06-26 ENCOUNTER — Encounter: Payer: Self-pay | Admitting: Cardiology

## 2015-08-13 ENCOUNTER — Other Ambulatory Visit (INDEPENDENT_AMBULATORY_CARE_PROVIDER_SITE_OTHER): Payer: Medicare Other

## 2015-08-13 ENCOUNTER — Encounter: Payer: Self-pay | Admitting: Internal Medicine

## 2015-08-13 ENCOUNTER — Ambulatory Visit (INDEPENDENT_AMBULATORY_CARE_PROVIDER_SITE_OTHER): Payer: Medicare Other | Admitting: Internal Medicine

## 2015-08-13 VITALS — BP 120/80 | HR 64 | Ht 58.75 in | Wt 118.5 lb

## 2015-08-13 DIAGNOSIS — R1013 Epigastric pain: Secondary | ICD-10-CM

## 2015-08-13 DIAGNOSIS — K219 Gastro-esophageal reflux disease without esophagitis: Secondary | ICD-10-CM

## 2015-08-13 LAB — COMPREHENSIVE METABOLIC PANEL
ALBUMIN: 4.4 g/dL (ref 3.5–5.2)
ALK PHOS: 53 U/L (ref 39–117)
ALT: 12 U/L (ref 0–35)
AST: 17 U/L (ref 0–37)
BUN: 15 mg/dL (ref 6–23)
CO2: 29 mEq/L (ref 19–32)
Calcium: 9.5 mg/dL (ref 8.4–10.5)
Chloride: 103 mEq/L (ref 96–112)
Creatinine, Ser: 0.76 mg/dL (ref 0.40–1.20)
GFR: 79.82 mL/min (ref >60.00)
Glucose, Bld: 107 mg/dL — ABNORMAL HIGH (ref 70–99)
POTASSIUM: 3.7 meq/L (ref 3.5–5.1)
Sodium: 140 mEq/L (ref 135–145)
TOTAL PROTEIN: 7.4 g/dL (ref 6.0–8.3)
Total Bilirubin: 0.7 mg/dL (ref 0.2–1.2)

## 2015-08-13 LAB — CBC WITH DIFFERENTIAL/PLATELET
BASOS PCT: 0.3 % (ref 0.0–3.0)
Basophils Absolute: 0 10*3/uL (ref 0.0–0.1)
EOS ABS: 0.1 10*3/uL (ref 0.0–0.7)
EOS PCT: 0.7 % (ref 0.0–5.0)
HCT: 47.1 % — ABNORMAL HIGH (ref 36.0–46.0)
HEMOGLOBIN: 15.7 g/dL — AB (ref 12.0–15.0)
LYMPHS PCT: 26.5 % (ref 12.0–46.0)
Lymphs Abs: 2.2 10*3/uL (ref 0.7–4.0)
MCHC: 33.2 g/dL (ref 30.0–36.0)
MCV: 99.5 fl (ref 78.0–100.0)
Monocytes Absolute: 0.5 10*3/uL (ref 0.1–1.0)
Monocytes Relative: 5.5 % (ref 3.0–12.0)
Neutro Abs: 5.5 10*3/uL (ref 1.4–7.7)
Neutrophils Relative %: 67 % (ref 43.0–77.0)
Platelets: 276 10*3/uL (ref 150.0–400.0)
RBC: 4.74 Mil/uL (ref 3.87–5.11)
RDW: 13.2 % (ref 11.5–15.5)
WBC: 8.2 10*3/uL (ref 4.0–10.5)

## 2015-08-13 LAB — LIPASE: Lipase: 19 U/L (ref 11.0–59.0)

## 2015-08-13 NOTE — Patient Instructions (Signed)
Your physician has requested that you go to the basement for the following lab work before leaving today:  CBC, CMET, Lipase  You have been scheduled for a CT scan of the abdomen and pelvis at [facility] ([address]---this is in the same building as [facility]).   You are scheduled on 08/18/2015 at 8:30am. You should arrive 15 minutes prior to your appointment time for registration. Please follow the written instructions below on the day of your exam:  WARNING: IF YOU ARE ALLERGIC TO IODINE/X-RAY DYE, PLEASE NOTIFY RADIOLOGY IMMEDIATELY AT [phone]! YOU WILL BE GIVEN A 13 HOUR PREMEDICATION PREP.  1) Do not eat or drink anything after 4:30am (4 hours prior to your test) 2) You have been given 2 bottles of oral contrast to drink. The solution may taste better if refrigerated, but do NOT add ice or any other liquid to this solution. Shake well before drinking.    Drink 1 bottle of contrast @ 6:30am (2 hours prior to your exam)  Drink 1 bottle of contrast @ 7:30am (1 hour prior to your exam)  You may take any medications as prescribed with a small amount of water except for the following: Metformin, Glucophage, Glucovance, Avandamet, Riomet, Fortamet, Actoplus Met, Janumet, Glumetza or Metaglip. The above medications must be held the day of the exam AND 48 hours after the exam.  The purpose of you drinking the oral contrast is to aid in the visualization of your intestinal tract. The contrast solution may cause some diarrhea. Before your exam is started, you will be given a small amount of fluid to drink. Depending on your individual set of symptoms, you may also receive an intravenous injection of x-ray contrast/dye. Plan on being at [facility] for 30 minutes or long, depending on the type of exam you are having performed.  If you have any questions regarding your exam or if you need to reschedule, you may call the CT department at [phone] between the hours  of 8:00 am and 5:00 pm, Monday-Friday.  ________________________________________________________________________

## 2015-08-13 NOTE — Progress Notes (Signed)
HISTORY OF PRESENT ILLNESS:  Michelle Lane is a 70 y.o. female who has been followed in this office for GERD. She was last evaluated 05/25/2015 with a chief complaint of postprandial bloating. See that dictation. Screening for celiac disease was negative. Empiric trial probiotic recommended with follow-up as needed. She declined colon cancer screening strategies at that time with her last colonoscopy occurring in August 2002 with diverticulosis. She presents today with the new chief complaint of severe mid/epigastric abdominal pain. This began approximately 3 weeks ago and was described as a pressure sensation which varied in intensity but persisted. Mostly centrally located with some left-sided component. No radiation into the back. She is status post cholecystectomy and appendectomy. Symptoms were clearly exacerbated by meals. She had continued on omeprazole 40 mg daily. No worsening of GERD symptoms or dysphagia. She does take 800 mg of ibuprofen daily for neuropathy pain. There was transient food avoidance pattern. Since her last visit her weight is down 3 pounds. There has been no change in bowel habits, melena, or hematochezia. Still with some bloating. She tells me that her discomfort resolved approximately 3 days ago. No additional GI complaints. She reports consuming 2 alcoholic beverages daily.  REVIEW OF SYSTEMS:  All non-GI ROS negative except for sinus and allergy, insomnia  Past Medical History  Diagnosis Date  . Diverticulosis   . Esophageal stricture   . Diaphragmatic hernia without mention of obstruction or gangrene   . Esophageal reflux     Past Surgical History  Procedure Laterality Date  . Appendectomy    . Cholecystectomy    . Tubal ligation    . Left oophorectomy      Social History Michelle Lane  reports that she has quit smoking. She has never used smokeless tobacco. She reports that she drinks alcohol. She reports that she does not use illicit drugs.  family  history includes Breast cancer in her mother; Diabetes in her brother; Heart disease in her brother; Kidney disease in her brother; Prostate cancer in her father. There is no history of Colon cancer.  No Known Allergies     PHYSICAL EXAMINATION: Vital signs: BP 120/80 mmHg  Pulse 64  Ht 4' 10.75" (1.492 m)  Wt 118 lb 8 oz (53.751 kg)  BMI 24.15 kg/m2  Constitutional: generally well-appearing, no acute distress Psychiatric: alert and oriented x3, cooperative Eyes: extraocular movements intact, anicteric, conjunctiva pink Mouth: oral pharynx moist, no lesions Neck: supple without thyromegaly Lymph: no lymphadenopathy Cardiovascular: heart regular rate and rhythm, no murmur Lungs: clear to auscultation bilaterally Abdomen: soft, nontender, nondistended, no obvious ascites, no peritoneal signs, normal bowel sounds, no organomegaly Rectal: Omitted Extremities: no lower extremity edema bilaterally Skin: no lesions on visible extremities Neuro: No focal deficits. Cranial nerves intact  ASSESSMENT:  #1. Several week history of significant epigastric and mid abdominal pain exacerbated by meals as described. Possible etiologies include NSAID related ulcer disease (though she is on chronic PPI), CBD stone postcholecystectomy, pancreatic disorders including possible transient pancreatitis, and possible mild partial bowel obstruction of uncertain etiology which has resolved. In any event, further investigation warranted #2. GERD. Classic symptoms seemingly controlled with omeprazole 40 mg daily. Last upper endoscopy performed December 2014 to evaluate early satiety and weight loss. The examination was normal #3. Diverticulosis on colonoscopy 2002. She has been resistant to follow-up screening strategies. Ongoing  PLAN:  #1. Continue PPI #2. CBC, comprehensive metabolic panel, serum lipase today #3. Contrast-enhanced CT scan of the abdomen and pelvis #4.  Continue omeprazole 40 mg daily #5.  May need to minimize NSAIDs and/or alcohol depending upon the outcome of the above. May also need diagnostic upper endoscopy and/or repeat blood work if the above workup unrevealing and pain were to recur. She is aware #6. Continue to consider follow-up colon cancer screening strategies  40 minutes was spent with the patient. Greater than 50% of the time use for counseling regarding her GI complaint, possible etiologies, and plan as outlined

## 2015-08-18 ENCOUNTER — Ambulatory Visit (INDEPENDENT_AMBULATORY_CARE_PROVIDER_SITE_OTHER)
Admission: RE | Admit: 2015-08-18 | Discharge: 2015-08-18 | Disposition: A | Discharge status: Home / Self Care | Payer: Medicare Other | Source: Ambulatory Visit | Attending: Internal Medicine | Admitting: Internal Medicine

## 2015-08-18 DIAGNOSIS — K219 Gastro-esophageal reflux disease without esophagitis: Secondary | ICD-10-CM | POA: Diagnosis not present

## 2015-08-18 DIAGNOSIS — R1013 Epigastric pain: Secondary | ICD-10-CM | POA: Diagnosis not present

## 2015-08-18 MED ORDER — IOHEXOL 300 MG/ML  SOLN
100.0000 mL | Freq: Once | INTRAMUSCULAR | Status: AC | PRN
  Administered 2015-08-18: 100 mL via INTRAVENOUS

## 2015-08-21 ENCOUNTER — Ambulatory Visit (AMBULATORY_SURGERY_CENTER): Payer: Self-pay

## 2015-08-21 VITALS — Ht 60.0 in | Wt 122.0 lb

## 2015-08-21 DIAGNOSIS — R1013 Epigastric pain: Secondary | ICD-10-CM

## 2015-08-21 DIAGNOSIS — G8929 Other chronic pain: Secondary | ICD-10-CM

## 2015-08-21 NOTE — Progress Notes (Signed)
No allergies to eggs or soy No home oxygen No past problems with anesthesia No diet/weight loss meds  Has email and internet; refused emmi 

## 2015-08-26 ENCOUNTER — Ambulatory Visit (AMBULATORY_SURGERY_CENTER): Payer: Medicare Other | Admitting: Internal Medicine

## 2015-08-26 ENCOUNTER — Encounter: Payer: Self-pay | Admitting: Internal Medicine

## 2015-08-26 VITALS — BP 131/60 | HR 57 | Temp 94.6°F | Resp 20 | Ht 60.0 in | Wt 122.0 lb

## 2015-08-26 DIAGNOSIS — K222 Esophageal obstruction: Secondary | ICD-10-CM

## 2015-08-26 DIAGNOSIS — K219 Gastro-esophageal reflux disease without esophagitis: Secondary | ICD-10-CM | POA: Diagnosis not present

## 2015-08-26 DIAGNOSIS — R1013 Epigastric pain: Secondary | ICD-10-CM

## 2015-08-26 MED ORDER — SODIUM CHLORIDE 0.9 % IV SOLN: 500.0000 mL | INTRAVENOUS | Status: DC

## 2015-08-26 MED ORDER — HYOSCYAMINE SULFATE 0.125 MG SL SUBL: 0.1250 mg | SUBLINGUAL_TABLET | SUBLINGUAL | Status: DC | PRN

## 2015-08-26 NOTE — Progress Notes (Deleted)
A/ox3 pleased with MAC, report to Sheila RN 

## 2015-08-26 NOTE — Patient Instructions (Signed)
YOU HAD AN ENDOSCOPIC PROCEDURE TODAY AT THE [facility]:   Refer to the procedure report that was given to you for any specific questions about what was found during the examination.  If the procedure report does not answer your questions, please call your gastroenterologist to clarify.  If you requested that your care partner not be given the details of your procedure findings, then the procedure report has been included in a sealed envelope for you to review at your convenience later.  YOU SHOULD EXPECT: Some feelings of bloating in the abdomen. Passage of more gas than usual.  Walking can help get rid of the air that was put into your GI tract during the procedure and reduce the bloating. If you had a lower endoscopy (such as a colonoscopy or flexible sigmoidoscopy) you may notice spotting of blood in your stool or on the toilet paper. If you underwent a bowel prep for your procedure, you may not have a normal bowel movement for a few days.  Please Note:  You might notice some irritation and congestion in your nose or some drainage.  This is from the oxygen used during your procedure.  There is no need for concern and it should clear up in a day or so.  SYMPTOMS TO REPORT IMMEDIATELY:     Following upper endoscopy (EGD)  Vomiting of blood or coffee ground material  New chest pain or pain under the shoulder blades  Painful or persistently difficult swallowing  New shortness of breath  Fever of 100F or higher  Black, tarry-looking stools  For urgent or emergent issues, a gastroenterologist can be reached at any hour by calling [phone].   DIET: Your first meal following the procedure should be a small meal and then it is ok to progress to your normal diet. Heavy or fried foods are harder to digest and may make you feel nauseous or bloated.  Likewise, meals heavy in dairy and vegetables can increase bloating.  Drink plenty of fluids but you should avoid alcoholic beverages  for 24 hours.  ACTIVITY:  You should plan to take it easy for the rest of today and you should NOT DRIVE or use heavy machinery until tomorrow (because of the sedation medicines used during the test).    FOLLOW UP: Our staff will call the number listed on your records the next business day following your procedure to check on you and address any questions or concerns that you may have regarding the information given to you following your procedure. If we do not reach you, we will leave a message.  However, if you are feeling well and you are not experiencing any problems, there is no need to return our call.  We will assume that you have returned to your regular daily activities without incident.  If any biopsies were taken you will be contacted by phone or by letter within the next 1-3 weeks.  Please call us at [phone] if you have not heard about the biopsies in 3 weeks.    SIGNATURES/CONFIDENTIALITY: You and/or your care partner have signed paperwork which will be entered into your electronic medical record.  These signatures attest to the fact that that the information above on your After Visit Summary has been reviewed and is understood.  Full responsibility of the confidentiality of this discharge information lies with you and/or your care-partner.  Resume medications. Call and schedule follow up with Dr. Marina Goodell for 6 weeks.Information given on [facility].

## 2015-08-26 NOTE — Progress Notes (Signed)
A/ox3 pleased with MAC, report to Sheila RN, dentition as pre-procedure 

## 2015-08-26 NOTE — Progress Notes (Signed)
Dental advisory given to patient 

## 2015-08-26 NOTE — Op Note (Signed)
[facility] [address] [place], [address]   ENDOSCOPY PROCEDURE REPORT  PATIENT: Michelle Lane, Michelle Lane  MR#: [id number] BIRTHDATE: May 28, 1946 , 69  yrs. old GENDER: female ENDOSCOPIST: Roxy Cedar, MD REFERRED BY:  .  Self / Office PROCEDURE DATE:  08/26/2015 PROCEDURE:  EGD, diagnostic ASA CLASS:     Class II INDICATIONS:  epigastric pain.  (Postprandial)... CT scan revealed nonspecific gastric thickening. For evaluation today MEDICATIONS: Monitored anesthesia care and Propofol 120 mg IV TOPICAL ANESTHETIC: none  DESCRIPTION OF PROCEDURE: After the risks benefits and alternatives of the procedure were thoroughly explained, informed consent was obtained.  The LB [id number] endoscope was introduced through the mouth and advanced to the second portion of the duodenum , Without limitations.  The instrument was slowly withdrawn as the mucosa was fully examined.  EXAM:Esophagus revealed a large caliber distal esophageal stricture without active inflammation.  Stomach revealed benign fundic gland polyps and a sliding hiatal hernia.  The duodenal bulb and postbulbar duodenum are normal.  Retroflexed views revealed a hiatal hernia.     The scope was then withdrawn from the patient and the procedure completed.  COMPLICATIONS: There were no immediate complications.  ENDOSCOPIC IMPRESSION: 1. GERD with hiatal hernia and an asymptomatic esophageal stricture 2. Benign gastric polyps. Otherwise normal EGD  RECOMMENDATIONS: 1.  Anti-reflux regimen to be followed 2.  Continue omeprazole 40 mg daily 3. Prescribe Levsin sublingual 0.125 mg; #30; 3 refills; take 1 or 2 sublingually as needed for abdominal discomfort 4. Please schedule a follow-up office visit with Dr. Marina Goodell in about 6 weeks. If you have any interval questions or problems please contact the office  REPEAT EXAM:  eSigned:  Roxy Cedar, MD 08/26/2015 10:26 AM    GE:[id number] Jacky Kindle, MD and  The Patient

## 2015-08-27 ENCOUNTER — Telehealth: Payer: Self-pay | Admitting: *Deleted

## 2015-08-27 NOTE — Telephone Encounter (Signed)
  Follow up Call-  Call back number 08/26/2015 07/11/2013  Post procedure Call Back phone  # [phone]  Permission to leave phone message Yes Yes     Patient questions:  Do you have a fever, pain , or abdominal swelling? No. Pain Score  0 *  Have you tolerated food without any problems? Yes.    Have you been able to return to your normal activities? Yes.    Do you have any questions about your discharge instructions: Diet   No. Medications  No. Follow up visit  No.  Do you have questions or concerns about your Care? No.  Actions: * If pain score is 4 or above: No action needed, pain <4.

## 2015-09-07 DIAGNOSIS — J3081 Allergic rhinitis due to animal (cat) (dog) hair and dander: Secondary | ICD-10-CM | POA: Diagnosis not present

## 2015-09-07 DIAGNOSIS — J3089 Other allergic rhinitis: Secondary | ICD-10-CM | POA: Diagnosis not present

## 2015-09-07 DIAGNOSIS — J301 Allergic rhinitis due to pollen: Secondary | ICD-10-CM | POA: Diagnosis not present

## 2015-10-08 ENCOUNTER — Ambulatory Visit: Payer: Medicare Other | Admitting: Internal Medicine

## 2015-10-28 ENCOUNTER — Ambulatory Visit: Payer: Medicare Other | Admitting: Internal Medicine

## 2015-11-25 ENCOUNTER — Telehealth: Payer: Self-pay

## 2015-11-25 DIAGNOSIS — K219 Gastro-esophageal reflux disease without esophagitis: Secondary | ICD-10-CM

## 2015-11-25 DIAGNOSIS — R1013 Epigastric pain: Secondary | ICD-10-CM

## 2015-11-25 MED ORDER — HYOSCYAMINE SULFATE 0.125 MG SL SUBL: 0.1250 mg | SUBLINGUAL_TABLET | SUBLINGUAL | Status: DC | PRN

## 2015-11-25 NOTE — Telephone Encounter (Signed)
Refilled Levsin 

## 2015-11-30 ENCOUNTER — Telehealth: Payer: Self-pay

## 2015-11-30 DIAGNOSIS — K219 Gastro-esophageal reflux disease without esophagitis: Secondary | ICD-10-CM

## 2015-11-30 DIAGNOSIS — R1013 Epigastric pain: Secondary | ICD-10-CM

## 2015-11-30 MED ORDER — HYOSCYAMINE SULFATE 0.125 MG SL SUBL: 0.1250 mg | SUBLINGUAL_TABLET | SUBLINGUAL | Status: DC | PRN

## 2015-11-30 NOTE — Telephone Encounter (Signed)
Refilled levsin

## 2016-02-15 DIAGNOSIS — H9011 Conductive hearing loss, unilateral, right ear, with unrestricted hearing on the contralateral side: Secondary | ICD-10-CM | POA: Diagnosis not present

## 2016-02-15 DIAGNOSIS — H6121 Impacted cerumen, right ear: Secondary | ICD-10-CM | POA: Diagnosis not present

## 2016-03-23 ENCOUNTER — Other Ambulatory Visit: Payer: Self-pay | Admitting: Internal Medicine

## 2016-03-23 DIAGNOSIS — R1013 Epigastric pain: Secondary | ICD-10-CM

## 2016-03-23 DIAGNOSIS — K219 Gastro-esophageal reflux disease without esophagitis: Secondary | ICD-10-CM

## 2016-04-20 DIAGNOSIS — N390 Urinary tract infection, site not specified: Secondary | ICD-10-CM | POA: Diagnosis not present

## 2016-04-20 DIAGNOSIS — Z23 Encounter for immunization: Secondary | ICD-10-CM | POA: Diagnosis not present

## 2016-04-20 DIAGNOSIS — R8290 Unspecified abnormal findings in urine: Secondary | ICD-10-CM | POA: Diagnosis not present

## 2016-04-21 DIAGNOSIS — E784 Other hyperlipidemia: Secondary | ICD-10-CM | POA: Diagnosis not present

## 2016-04-25 ENCOUNTER — Other Ambulatory Visit: Payer: Self-pay | Admitting: Internal Medicine

## 2016-04-25 DIAGNOSIS — Z1212 Encounter for screening for malignant neoplasm of rectum: Secondary | ICD-10-CM | POA: Diagnosis not present

## 2016-04-25 DIAGNOSIS — Z1231 Encounter for screening mammogram for malignant neoplasm of breast: Secondary | ICD-10-CM

## 2016-04-27 DIAGNOSIS — K219 Gastro-esophageal reflux disease without esophagitis: Secondary | ICD-10-CM | POA: Diagnosis not present

## 2016-04-27 DIAGNOSIS — Z87448 Personal history of other diseases of urinary system: Secondary | ICD-10-CM | POA: Diagnosis not present

## 2016-04-27 DIAGNOSIS — J45909 Unspecified asthma, uncomplicated: Secondary | ICD-10-CM | POA: Diagnosis not present

## 2016-04-27 DIAGNOSIS — Z0000 Encounter for general adult medical examination without abnormal findings: Secondary | ICD-10-CM | POA: Diagnosis not present

## 2016-04-27 DIAGNOSIS — E784 Other hyperlipidemia: Secondary | ICD-10-CM | POA: Diagnosis not present

## 2016-04-27 DIAGNOSIS — Z1389 Encounter for screening for other disorder: Secondary | ICD-10-CM | POA: Diagnosis not present

## 2016-04-27 DIAGNOSIS — G629 Polyneuropathy, unspecified: Secondary | ICD-10-CM | POA: Diagnosis not present

## 2016-04-27 DIAGNOSIS — Z6825 Body mass index (BMI) 25.0-25.9, adult: Secondary | ICD-10-CM | POA: Diagnosis not present

## 2016-05-17 DIAGNOSIS — M25561 Pain in right knee: Secondary | ICD-10-CM | POA: Diagnosis not present

## 2016-05-20 ENCOUNTER — Ambulatory Visit
Admission: RE | Admit: 2016-05-20 | Discharge: 2016-05-20 | Disposition: A | Discharge status: Home / Self Care | Payer: Medicare Other | Source: Ambulatory Visit | Attending: Internal Medicine | Admitting: Internal Medicine

## 2016-05-20 DIAGNOSIS — Z1231 Encounter for screening mammogram for malignant neoplasm of breast: Secondary | ICD-10-CM

## 2016-05-25 ENCOUNTER — Other Ambulatory Visit: Payer: Self-pay | Admitting: Internal Medicine

## 2016-05-25 DIAGNOSIS — R928 Other abnormal and inconclusive findings on diagnostic imaging of breast: Secondary | ICD-10-CM

## 2016-05-26 DIAGNOSIS — Z780 Asymptomatic menopausal state: Secondary | ICD-10-CM | POA: Diagnosis not present

## 2016-05-27 ENCOUNTER — Encounter (HOSPITAL_COMMUNITY): Payer: Self-pay | Admitting: Emergency Medicine

## 2016-05-27 ENCOUNTER — Emergency Department (HOSPITAL_COMMUNITY): Payer: Medicare Other

## 2016-05-27 ENCOUNTER — Emergency Department (HOSPITAL_COMMUNITY)
Admission: EM | Admit: 2016-05-27 | Discharge: 2016-05-27 | Disposition: A | Discharge status: Home / Self Care | Payer: Medicare Other | Attending: Emergency Medicine | Admitting: Emergency Medicine

## 2016-05-27 DIAGNOSIS — J45909 Unspecified asthma, uncomplicated: Secondary | ICD-10-CM | POA: Diagnosis not present

## 2016-05-27 DIAGNOSIS — R791 Abnormal coagulation profile: Secondary | ICD-10-CM | POA: Insufficient documentation

## 2016-05-27 DIAGNOSIS — N3000 Acute cystitis without hematuria: Secondary | ICD-10-CM | POA: Insufficient documentation

## 2016-05-27 DIAGNOSIS — Z87891 Personal history of nicotine dependence: Secondary | ICD-10-CM | POA: Insufficient documentation

## 2016-05-27 DIAGNOSIS — Z79899 Other long term (current) drug therapy: Secondary | ICD-10-CM | POA: Diagnosis not present

## 2016-05-27 DIAGNOSIS — R413 Other amnesia: Secondary | ICD-10-CM | POA: Diagnosis not present

## 2016-05-27 LAB — CBC
HCT: 44.4 % (ref 36.0–46.0)
HEMOGLOBIN: 15.2 g/dL — AB (ref 12.0–15.0)
MCH: 32.5 pg (ref 26.0–34.0)
MCHC: 34.2 g/dL (ref 30.0–36.0)
MCV: 94.9 fL (ref 78.0–100.0)
PLATELETS: 216 10*3/uL (ref 150–400)
RBC: 4.68 MIL/uL (ref 3.87–5.11)
RDW: 12.7 % (ref 11.5–15.5)
WBC: 8.7 10*3/uL (ref 4.0–10.5)

## 2016-05-27 LAB — COMPREHENSIVE METABOLIC PANEL
ALK PHOS: 50 U/L (ref 38–126)
ALT: 23 U/L (ref 14–54)
ANION GAP: 8 (ref 5–15)
AST: 27 U/L (ref 15–41)
Albumin: 4.2 g/dL (ref 3.5–5.0)
BILIRUBIN TOTAL: 1.4 mg/dL — AB (ref 0.3–1.2)
BUN: 14 mg/dL (ref 6–20)
CALCIUM: 9.4 mg/dL (ref 8.9–10.3)
CO2: 24 mmol/L (ref 22–32)
Chloride: 108 mmol/L (ref 101–111)
Creatinine, Ser: 0.66 mg/dL (ref 0.44–1.00)
Glucose, Bld: 96 mg/dL (ref 65–99)
Potassium: 3.9 mmol/L (ref 3.5–5.1)
Sodium: 140 mmol/L (ref 135–145)
TOTAL PROTEIN: 7.2 g/dL (ref 6.5–8.1)

## 2016-05-27 LAB — URINALYSIS, ROUTINE W REFLEX MICROSCOPIC
Bilirubin Urine: NEGATIVE
Glucose, UA: NEGATIVE mg/dL
KETONES UR: 15 mg/dL — AB
NITRITE: NEGATIVE
PH: 6 (ref 5.0–8.0)
Protein, ur: NEGATIVE mg/dL
SPECIFIC GRAVITY, URINE: 1.008 (ref 1.005–1.030)

## 2016-05-27 LAB — ETHANOL: Alcohol, Ethyl (B): <5 mg/dL (ref <5)

## 2016-05-27 LAB — DIFFERENTIAL
Basophils Absolute: 0.1 10*3/uL (ref 0.0–0.1)
Basophils Relative: 1 %
EOS PCT: 2 %
Eosinophils Absolute: 0.1 10*3/uL (ref 0.0–0.7)
LYMPHS ABS: 2 10*3/uL (ref 0.7–4.0)
LYMPHS PCT: 23 %
MONO ABS: 0.4 10*3/uL (ref 0.1–1.0)
Monocytes Relative: 4 %
Neutro Abs: 6.2 10*3/uL (ref 1.7–7.7)
Neutrophils Relative %: 70 %

## 2016-05-27 LAB — PROTIME-INR
INR: 0.94
PROTHROMBIN TIME: 12.6 s (ref 11.4–15.2)

## 2016-05-27 LAB — RAPID URINE DRUG SCREEN, HOSP PERFORMED
Amphetamines: NOT DETECTED
Barbiturates: NOT DETECTED
Benzodiazepines: NOT DETECTED
Cocaine: NOT DETECTED
OPIATES: NOT DETECTED
TETRAHYDROCANNABINOL: NOT DETECTED

## 2016-05-27 LAB — URINE MICROSCOPIC-ADD ON

## 2016-05-27 LAB — I-STAT TROPONIN, ED: TROPONIN I, POC: 0 ng/mL (ref 0.00–0.08)

## 2016-05-27 LAB — APTT: aPTT: 27 seconds (ref 24–36)

## 2016-05-27 MED ORDER — CEPHALEXIN 500 MG PO CAPS: 500.0000 mg | ORAL_CAPSULE | Freq: Two times a day (BID) | ORAL | 0 refills | Status: AC

## 2016-05-27 NOTE — ED Notes (Signed)
Pt ambulated to restroom from room, tolerated well. 

## 2016-05-27 NOTE — ED Triage Notes (Signed)
Pt sts memory loss today that is not per norm; pt denies other sx

## 2016-05-27 NOTE — ED Provider Notes (Signed)
[facility]-EMERGENCY DEPT Provider Note   CSN: [id number] Arrival date & time: 05/27/16  1053     History   Chief Complaint Chief Complaint  Patient presents with  . Memory Loss    HPI Michelle Lane is a 70 y.o. female with no significant PMH history, since to the emergency department with her husband noting the acute onset of amnesia this morning. She was last seen normal around 7:45 AM by her husband when she took her grandchild to school. Patient states that she has been under increased stress over the last few days and she has been caring for the children while their parents are traveling [place]. The patient denies any associated weakness, numbness, dysarthria, changes in her vision, headache dizziness, nausea, vomiting associated with this. She states that she simply cannot remember multiple things that she has done this morning including troponin I a child at school and driving back home. Patient states that her memory has been gradually returning intermittently but she still notes some spotty amnesia to certain events. She has no history of prior episodes similar to this. No recent falls, trauma.   HPI  Past Medical History:  Diagnosis Date  . Asthma   . Diaphragmatic hernia without mention of obstruction or gangrene   . Diverticulosis   . Esophageal reflux   . Esophageal stricture   . Gastric polyps    benign    Patient Active Problem List   Diagnosis Date Noted  . Numbness 06/05/2014  . Nausea alone 07/09/2013  . Early satiety 07/09/2013  . NSAID long-term use 07/09/2013  . Constipation 07/09/2013  . ESOPHAGEAL STRICTURE 04/28/2008  . GERD 04/28/2008  . HIATAL HERNIA 04/28/2008  . DIVERTICULOSIS, COLON 04/28/2008    Past Surgical History:  Procedure Laterality Date  . APPENDECTOMY    . CHOLECYSTECTOMY    . LEFT OOPHORECTOMY    . TUBAL LIGATION      OB History    No data available       Home Medications    Prior to Admission medications   Medication  Sig Start Date End Date Taking? Authorizing Provider  acetaminophen (TYLENOL) 500 MG tablet Take 500 mg by mouth 2 (two) times daily. As needed    Historical Provider, MD  bifidobacterium infantis (ALIGN) capsule Take 1 capsule by mouth daily. Patient not taking: Reported on 08/26/2015 05/25/15   Hilarie FredricksonJohn N Perry, MD  cephALEXin (KEFLEX) 500 MG capsule Take 1 capsule (500 mg total) by mouth 2 (two) times daily. 05/27/16 06/03/16  Francoise CeoWarren S Dayna Geurts, DO  hyoscyamine (LEVSIN SL) 0.125 MG SL tablet dissolve 1 tablet under the tongue every 4 hours if needed 03/23/16   Hilarie FredricksonJohn N Perry, MD  omeprazole (PRILOSEC) 40 MG capsule take 1 capsule by mouth twice a day 01/05/15   Hilarie FredricksonJohn N Perry, MD  PROAIR HFA 108 [phone](90 Base) MCG/ACT inhaler Take 2 puffs by mouth as needed. 05/13/15   Historical Provider, MD  Simethicone (PHAZYME PO) Take 3 tablets by mouth as needed.     Historical Provider, MD  zolpidem (AMBIEN) 10 MG tablet Take 10 mg by mouth at bedtime.  07/01/13   Historical Provider, MD    Family History Family History  Problem Relation Age of Onset  . Breast cancer Mother   . Prostate cancer Father   . Diabetes Brother     x 3   . Heart disease Brother     x 2  . Kidney disease Brother   . Colon cancer Neg  Hx     Social History Social History  Substance Use Topics  . Smoking status: Former Games developermoker  . Smokeless tobacco: Never Used     Comment: stopped at age 89  . Alcohol use 0.0 oz/week     Comment: 2 glasses wine daily     Allergies   Other   Review of Systems Review of Systems  Constitutional: Positive for activity change. Negative for chills and fever.  Respiratory: Negative for cough, chest tightness and shortness of breath.   Cardiovascular: Negative for chest pain.  Gastrointestinal: Negative for abdominal pain, diarrhea, nausea and vomiting.  Genitourinary: Positive for frequency and urgency. Negative for difficulty urinating, dysuria and hematuria.  Musculoskeletal: Negative for back pain  and neck pain.  Skin: Negative for rash and wound.  Neurological: Negative for dizziness, tremors, seizures, syncope, facial asymmetry, speech difficulty, weakness, light-headedness, numbness and headaches.  Psychiatric/Behavioral: Positive for confusion.  All other systems reviewed and are negative.    Physical Exam Updated Vital Signs BP 105/90   Pulse 86   Temp 97.9 F (36.6 C) (Oral)   Resp 22   SpO2 97%   Physical Exam  Constitutional: She is oriented to person, place, and time. She appears well-developed and well-nourished. No distress.  HENT:  Head: Normocephalic and atraumatic.  Nose: Nose normal.  Mouth/Throat: Oropharynx is clear and moist.  Eyes: Conjunctivae and EOM are normal. Pupils are equal, round, and reactive to light.  Neck: Normal range of motion. Neck supple.  Cardiovascular: Normal rate, regular rhythm, normal heart sounds and intact distal pulses.   Pulmonary/Chest: Effort normal and breath sounds normal.  Abdominal: Soft. She exhibits no distension. There is no tenderness.  Musculoskeletal: She exhibits no edema or tenderness.  Neurological: She is alert and oriented to person, place, and time. She has normal strength. No cranial nerve deficit or sensory deficit. Coordination normal. GCS eye subscore is 4. GCS verbal subscore is 5. GCS motor subscore is 6.  No dysmetria with finger to nose or heel to shin. No focal deficits noted.   Skin: Skin is warm and dry. She is not diaphoretic.  Nursing note and vitals reviewed.    ED Treatments / Results  Labs (all labs ordered are listed, but only abnormal results are displayed) Labs Reviewed  CBC - Abnormal; Notable for the following:       Result Value   Hemoglobin 15.2 (*)    All other components within normal limits  COMPREHENSIVE METABOLIC PANEL - Abnormal; Notable for the following:    Total Bilirubin 1.4 (*)    All other components within normal limits  URINALYSIS, ROUTINE W REFLEX MICROSCOPIC (NOT  [facility]) - Abnormal; Notable for the following:    Hgb urine dipstick SMALL (*)    Ketones, ur 15 (*)    Leukocytes, UA MODERATE (*)    All other components within normal limits  URINE MICROSCOPIC-ADD ON - Abnormal; Notable for the following:    Squamous Epithelial / LPF 6-30 (*)    Bacteria, UA MANY (*)    All other components within normal limits  URINE CULTURE  PROTIME-INR  APTT  DIFFERENTIAL  RAPID URINE DRUG SCREEN, HOSP PERFORMED  ETHANOL  I-STAT TROPOININ, ED    EKG  EKG Interpretation  Date/Time:  Friday May 27 2016 11:21:49 EDT Ventricular Rate:  71 PR Interval:    QRS Duration: 97 QT Interval:  386 QTC Calculation: 420 R Axis:   0 Text Interpretation:  Sinus rhythm Consider left  atrial enlargement Nonspecific T abnrm, anterolateral leads No previous ECGs available Confirmed by YAO  MD, DAVID ([id number]) on 05/27/2016 11:24:01 AM Also confirmed by Silverio Lay  MD, DAVID ([id number]), editor Stout CT, Jola Babinski [phone])  on 05/27/2016 12:00:59 PM       Radiology Ct Head Wo Contrast  Result Date: 05/27/2016 CLINICAL DATA:  Memory loss starting this morning EXAM: CT HEAD WITHOUT CONTRAST TECHNIQUE: Contiguous axial images were obtained from the base of the skull through the vertex without intravenous contrast. COMPARISON:  None. FINDINGS: Brain: No intracranial hemorrhage, mass effect or midline shift. No acute cortical infarction. Mild cerebral atrophy. No mass lesion is noted on this unenhanced scan. The gray and white-matter differentiation is preserved. Vascular: Mild atherosclerotic calcifications of carotid siphon. Skull: No skull fracture Sinuses/Orbits: No acute findings Other: None IMPRESSION: No acute intracranial abnormality. No definite acute cortical infarction. Mild cerebral atrophy. Electronically Signed   By: Natasha Mead M.D.   On: 05/27/2016 13:17    Procedures Procedures (including critical care time)  Medications Ordered in ED Medications - No data to  display   Initial Impression / Assessment and Plan / ED Course  I have reviewed the triage vital signs and the nursing notes.  Pertinent labs & imaging results that were available during my care of the patient were reviewed by me and considered in my medical decision making (see chart for details).  Clinical Course   70 y.o. female presents with an episode of amnesia that occurred this AM, but no other focal deficits. Exam as above, reassuring.   CT head was done and showed no evidence of acute CVA. Labs were drawn and returned showing no significant abnormalities. UA returned showing moderate leuks and many bacteria. Sent for culture.  Given this UA result feel that her sx may be associated with UTI versus transient global amnesia. Given no other focal neuro deficits low suspicion for TIA/CVA. When asked, the patient notes a history of similar reaction to a UTI previously. She states that she feels better currently, and feels her memory is coming back gradually but states that she needs some little reminders intermittently.  Rx'd keflex for UTI.   She was recommended to follow up closely with her PCP in a few days and strict return precautions were given for worsening or concerns. She was also recommended to follow up with neuro as an outpatient for further evaluation if her sx persist. This plan was discussed with the patient at the bedside and they stated both understanding and agreement with this plan.   Final Clinical Impressions(s) / ED Diagnoses   Final diagnoses:  Memory loss  Acute cystitis without hematuria    New Prescriptions Discharge Medication List as of 05/27/2016  2:14 PM    START taking these medications   Details  cephALEXin (KEFLEX) 500 MG capsule Take 1 capsule (500 mg total) by mouth 2 (two) times daily., Starting Fri 05/27/2016, Until Fri 06/03/2016, Print         Francoise Ceo, DO 05/27/16 1806    Charlynne Pander, MD 05/30/16 1018

## 2016-05-29 LAB — URINE CULTURE: Culture: 80000 — AB

## 2016-05-30 ENCOUNTER — Telehealth (HOSPITAL_BASED_OUTPATIENT_CLINIC_OR_DEPARTMENT_OTHER): Payer: Self-pay | Admitting: Emergency Medicine

## 2016-05-30 NOTE — Telephone Encounter (Signed)
Post ED Visit - Positive Culture Follow-up  Culture report reviewed by antimicrobial stewardship pharmacist:  []  Enzo BiNathan Batchelder, Pharm.D. []  Celedonio MiyamotoJeremy Frens, Pharm.D., BCPS []  Garvin FilaMike Maccia, Pharm.D. []  Georgina PillionElizabeth Martin, Pharm.D., BCPS []  DowsMinh Pham, [address] BoulevardPharm.D., BCPS, AAHIVP []  Estella HuskMichelle Turner, Pharm.D., BCPS, AAHIVP []  Tennis Mustassie Stewart, Pharm.D. []  Sherle Poeob Vincent, [place]. Audie PintoA. Anderson PharmD  Positive urine culture Treated with cephalexin, organism sensitive to the same and no further patient follow-up is required at this time.  Berle MullMiller, Kyrian Stage 05/30/2016, 10:13 AM

## 2016-06-01 ENCOUNTER — Ambulatory Visit
Admission: RE | Admit: 2016-06-01 | Discharge: 2016-06-01 | Disposition: A | Discharge status: Home / Self Care | Payer: Medicare Other | Source: Ambulatory Visit | Attending: Internal Medicine | Admitting: Internal Medicine

## 2016-06-01 DIAGNOSIS — R921 Mammographic calcification found on diagnostic imaging of breast: Secondary | ICD-10-CM | POA: Diagnosis not present

## 2016-06-01 DIAGNOSIS — R928 Other abnormal and inconclusive findings on diagnostic imaging of breast: Secondary | ICD-10-CM

## 2016-06-03 DIAGNOSIS — N390 Urinary tract infection, site not specified: Secondary | ICD-10-CM | POA: Diagnosis not present

## 2016-06-03 DIAGNOSIS — R8299 Other abnormal findings in urine: Secondary | ICD-10-CM | POA: Diagnosis not present

## 2016-06-06 DIAGNOSIS — N3000 Acute cystitis without hematuria: Secondary | ICD-10-CM | POA: Diagnosis not present

## 2016-06-08 DIAGNOSIS — J301 Allergic rhinitis due to pollen: Secondary | ICD-10-CM | POA: Diagnosis not present

## 2016-06-08 DIAGNOSIS — J3081 Allergic rhinitis due to animal (cat) (dog) hair and dander: Secondary | ICD-10-CM | POA: Diagnosis not present

## 2016-06-08 DIAGNOSIS — J3089 Other allergic rhinitis: Secondary | ICD-10-CM | POA: Diagnosis not present

## 2016-06-08 DIAGNOSIS — R05 Cough: Secondary | ICD-10-CM | POA: Diagnosis not present

## 2016-08-06 ENCOUNTER — Other Ambulatory Visit: Payer: Self-pay | Admitting: Internal Medicine

## 2016-08-12 ENCOUNTER — Other Ambulatory Visit: Payer: Self-pay | Admitting: Internal Medicine

## 2016-08-12 DIAGNOSIS — K219 Gastro-esophageal reflux disease without esophagitis: Secondary | ICD-10-CM

## 2016-08-12 DIAGNOSIS — R1013 Epigastric pain: Secondary | ICD-10-CM

## 2016-09-26 DIAGNOSIS — R350 Frequency of micturition: Secondary | ICD-10-CM | POA: Diagnosis not present

## 2016-10-05 ENCOUNTER — Other Ambulatory Visit: Payer: Self-pay | Admitting: Internal Medicine

## 2016-10-05 DIAGNOSIS — R1013 Epigastric pain: Secondary | ICD-10-CM

## 2016-10-05 DIAGNOSIS — K219 Gastro-esophageal reflux disease without esophagitis: Secondary | ICD-10-CM

## 2016-10-20 DIAGNOSIS — N3020 Other chronic cystitis without hematuria: Secondary | ICD-10-CM | POA: Diagnosis not present

## 2016-10-20 DIAGNOSIS — N3000 Acute cystitis without hematuria: Secondary | ICD-10-CM | POA: Diagnosis not present

## 2016-12-20 DIAGNOSIS — H52203 Unspecified astigmatism, bilateral: Secondary | ICD-10-CM | POA: Diagnosis not present

## 2017-04-03 ENCOUNTER — Other Ambulatory Visit: Payer: Self-pay | Admitting: Internal Medicine

## 2017-04-24 DIAGNOSIS — E7849 Other hyperlipidemia: Secondary | ICD-10-CM | POA: Diagnosis not present

## 2017-05-01 DIAGNOSIS — E7849 Other hyperlipidemia: Secondary | ICD-10-CM | POA: Diagnosis not present

## 2017-05-01 DIAGNOSIS — Z0000 Encounter for general adult medical examination without abnormal findings: Secondary | ICD-10-CM | POA: Diagnosis not present

## 2017-05-01 DIAGNOSIS — G629 Polyneuropathy, unspecified: Secondary | ICD-10-CM | POA: Diagnosis not present

## 2017-05-01 DIAGNOSIS — K219 Gastro-esophageal reflux disease without esophagitis: Secondary | ICD-10-CM | POA: Diagnosis not present

## 2017-05-02 ENCOUNTER — Other Ambulatory Visit: Payer: Self-pay | Admitting: Internal Medicine

## 2017-05-02 DIAGNOSIS — R921 Mammographic calcification found on diagnostic imaging of breast: Secondary | ICD-10-CM

## 2017-05-09 DIAGNOSIS — Z1212 Encounter for screening for malignant neoplasm of rectum: Secondary | ICD-10-CM | POA: Diagnosis not present

## 2017-05-22 ENCOUNTER — Ambulatory Visit
Admission: RE | Admit: 2017-05-22 | Discharge: 2017-05-22 | Disposition: A | Discharge status: Home / Self Care | Payer: Medicare Other | Source: Ambulatory Visit | Attending: Internal Medicine | Admitting: Internal Medicine

## 2017-05-22 DIAGNOSIS — R921 Mammographic calcification found on diagnostic imaging of breast: Secondary | ICD-10-CM

## 2017-08-02 DIAGNOSIS — N3000 Acute cystitis without hematuria: Secondary | ICD-10-CM | POA: Diagnosis not present

## 2017-08-04 DIAGNOSIS — N3000 Acute cystitis without hematuria: Secondary | ICD-10-CM | POA: Diagnosis not present

## 2017-11-16 ENCOUNTER — Telehealth: Payer: Self-pay | Admitting: Internal Medicine

## 2017-11-16 ENCOUNTER — Other Ambulatory Visit: Payer: Self-pay | Admitting: Internal Medicine

## 2017-11-16 MED ORDER — OMEPRAZOLE 40 MG PO CPDR: 40.0000 mg | DELAYED_RELEASE_CAPSULE | Freq: Two times a day (BID) | ORAL | 4 refills | Status: DC

## 2017-11-16 NOTE — Telephone Encounter (Signed)
Refilled Omeprazole; patient needs to keep upcoming appointment

## 2017-11-30 IMAGING — CT CT HEAD W/O CM
3 of 4 series · 18 of 47 positions shown, 21 images · non-contrast
Comparison: None.

CLINICAL DATA: Memory loss starting this morning

EXAM:
CT HEAD WITHOUT CONTRAST
TECHNIQUE: Contiguous axial images were obtained from the base of the skull
through the vertex without intravenous contrast.

[Series 201: head w/o, idose (1) · axial · non-contrast · 0.40mm/px · z∈[+77,+197]mm · 12 of 28 slices shown, 15 images]
[im 2/28  brain]
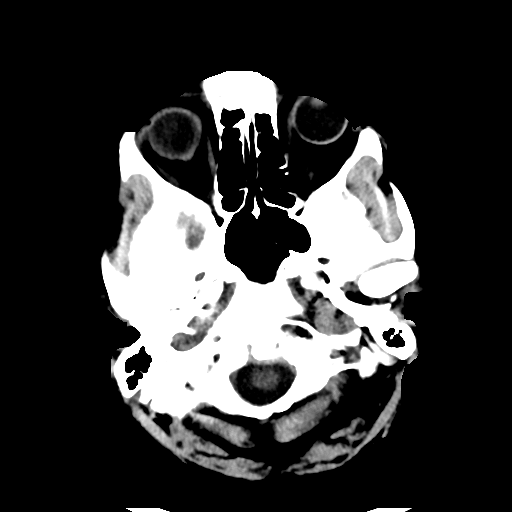
[im 2/28  bone]
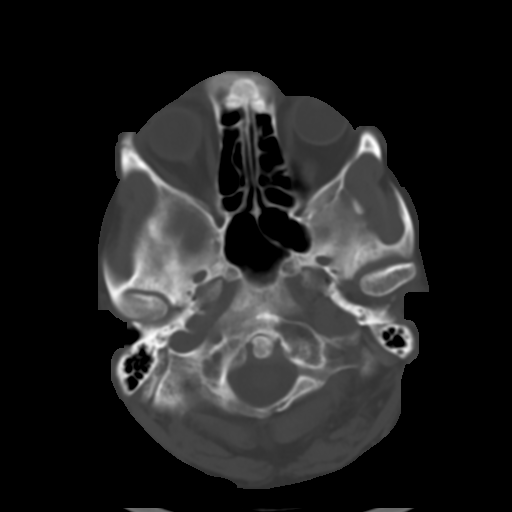
[im 4/28  brain]
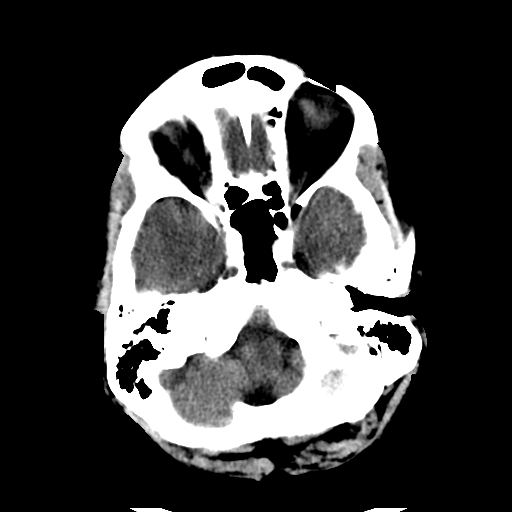
[im 6/28  brain]
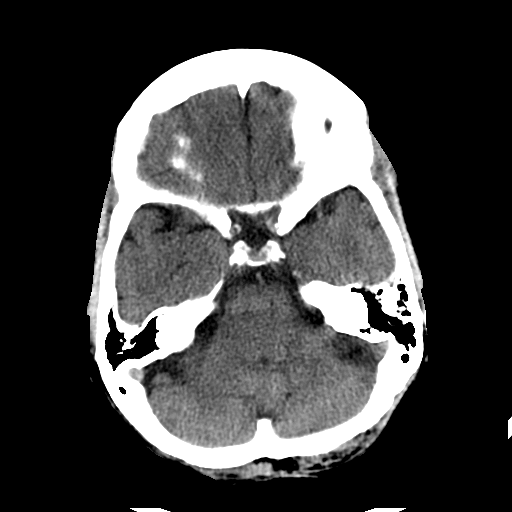
[im 8/28  brain]
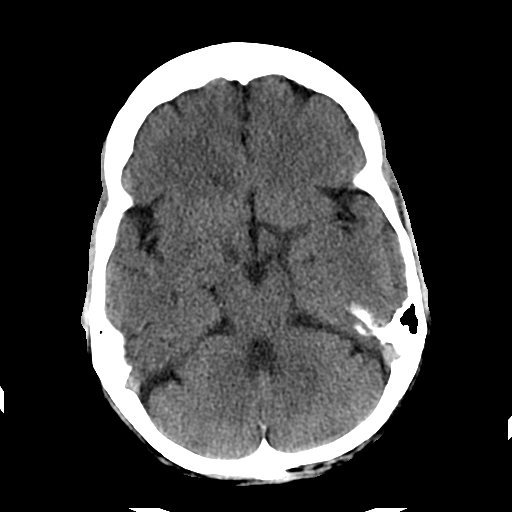
[im 10/28  brain]
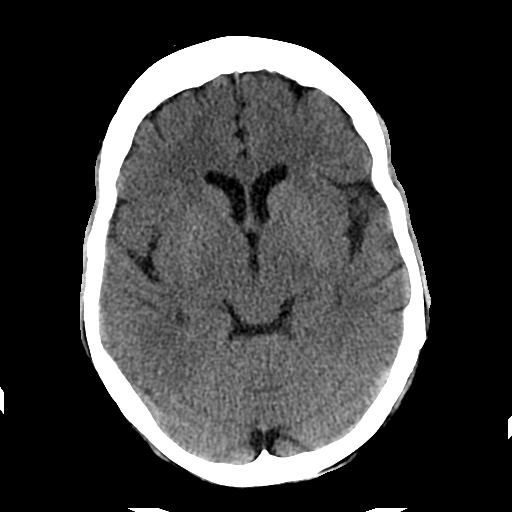
[im 10/28  bone]
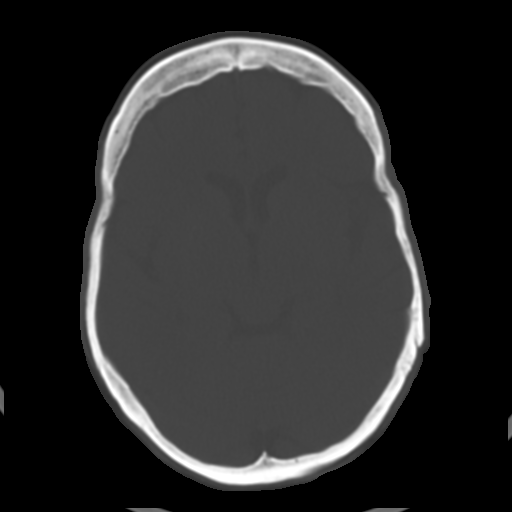
[im 12/28  brain]
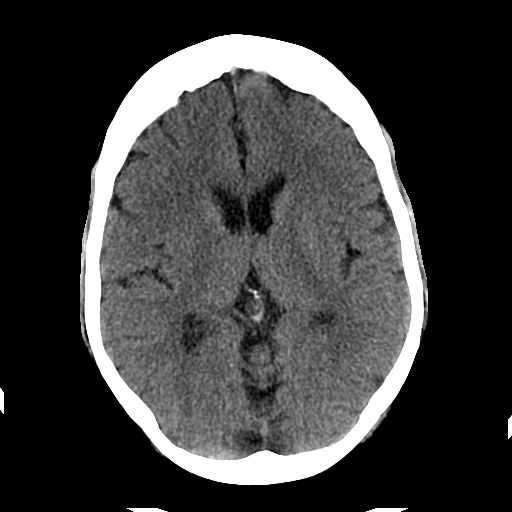
[im 16/28  brain]
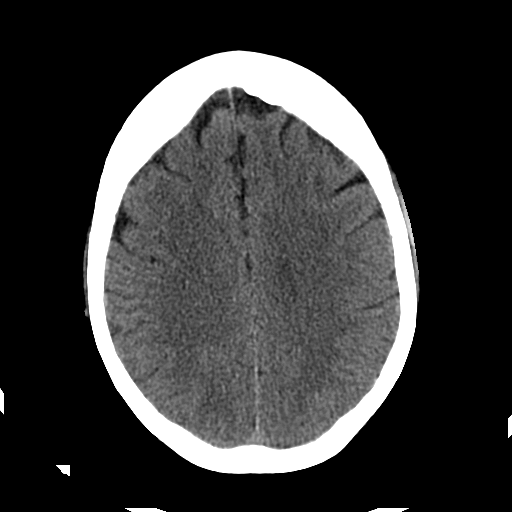
[im 18/28  brain]
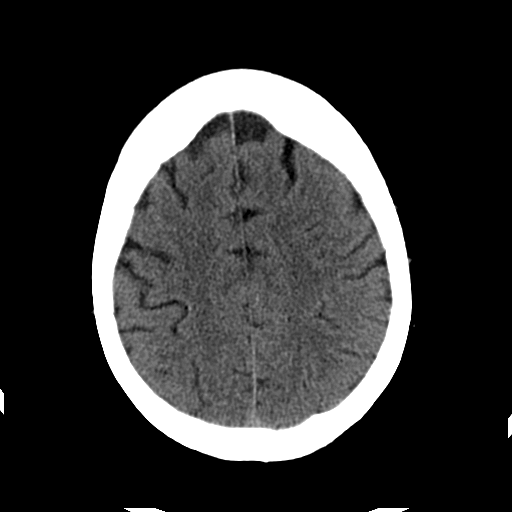
[im 20/28  brain]
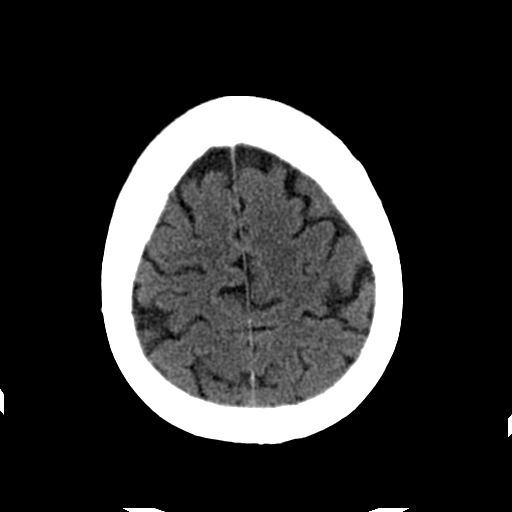
[im 20/28  bone]
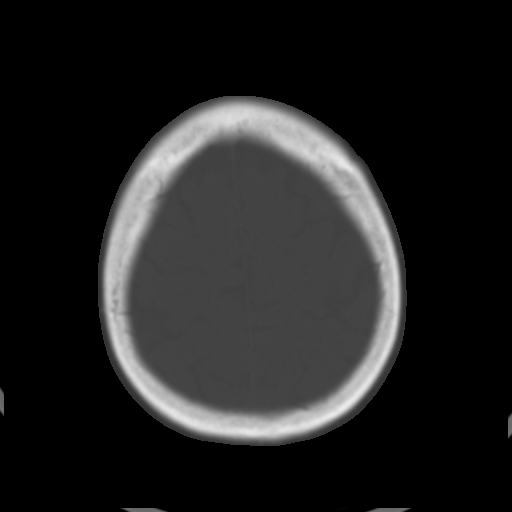
[im 22/28  brain]
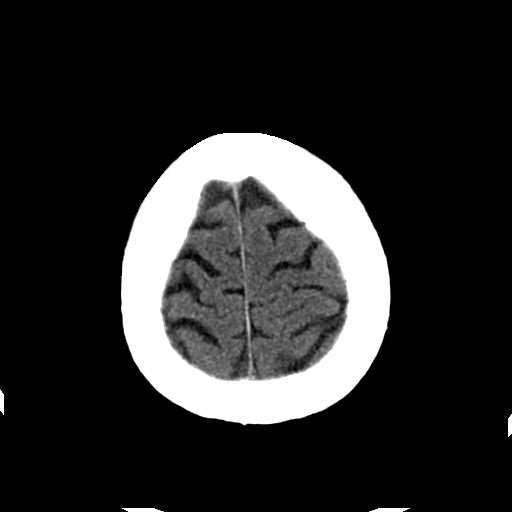
[im 24/28  brain]
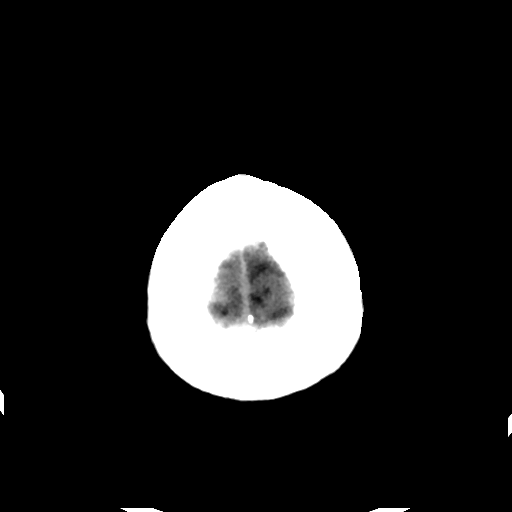
[im 26/28  brain]
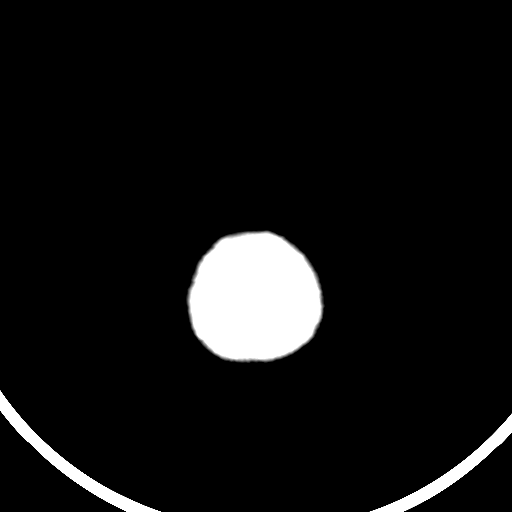

[Series 203: coronal st, idose (1) · coronal · 0.40mm/px · 3 of 66 slices shown]
[im 22/66  brain]
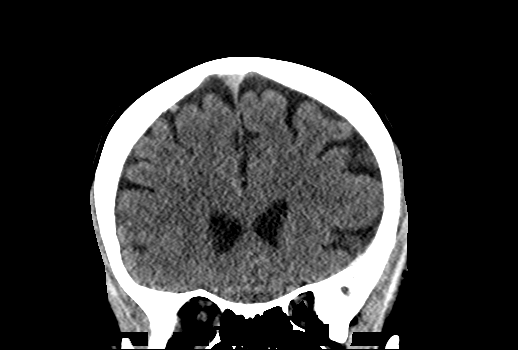
[im 29/66  brain]
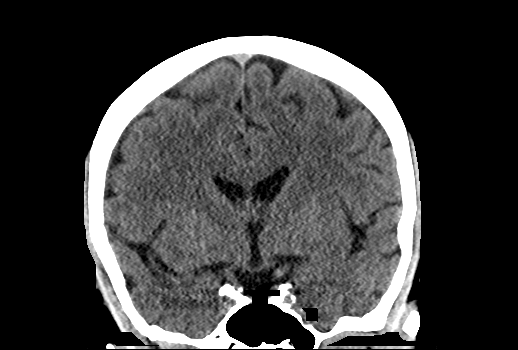
[im 37/66  brain]
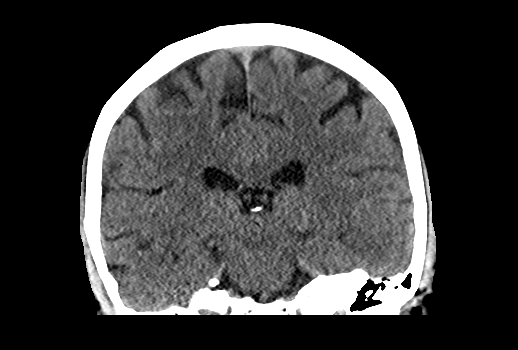

[Series 204: sagittal st, idose (1) · sagittal · 0.40mm/px · 3 of 69 slices shown]
[im 23/69  brain]
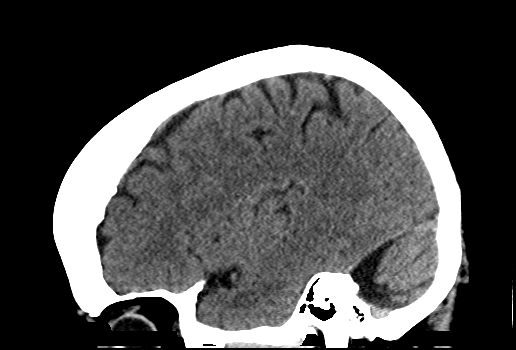
[im 35/69  brain]
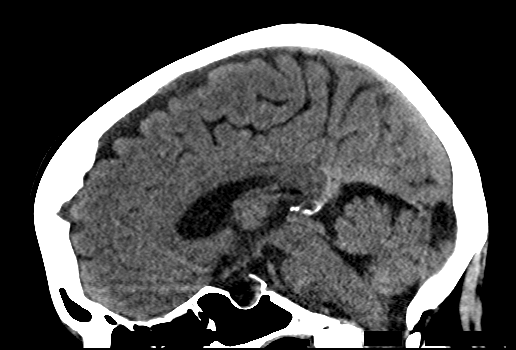
[im 46/69  brain]
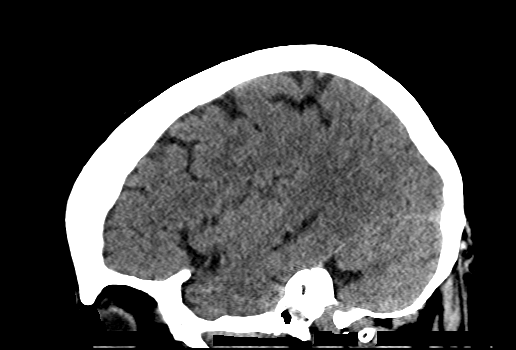

[18 of 47 positions shown; findings below may reference images not displayed]

FINDINGS: Brain: No intracranial hemorrhage, mass effect or midline shift. No
acute cortical infarction. Mild cerebral atrophy. No mass lesion is
noted on this unenhanced scan. The gray and white-matter
differentiation is preserved.

Vascular: Mild atherosclerotic calcifications of carotid siphon.

Skull: No skull fracture

Sinuses/Orbits: No acute findings

Other: None
IMPRESSION: No acute intracranial abnormality. No definite acute cortical
infarction. Mild cerebral atrophy.

## 2017-12-05 DIAGNOSIS — M7541 Impingement syndrome of right shoulder: Secondary | ICD-10-CM | POA: Diagnosis not present

## 2017-12-05 DIAGNOSIS — M25511 Pain in right shoulder: Secondary | ICD-10-CM | POA: Diagnosis not present

## 2017-12-20 DIAGNOSIS — H2513 Age-related nuclear cataract, bilateral: Secondary | ICD-10-CM | POA: Diagnosis not present

## 2017-12-20 DIAGNOSIS — H524 Presbyopia: Secondary | ICD-10-CM | POA: Diagnosis not present

## 2017-12-20 DIAGNOSIS — H52203 Unspecified astigmatism, bilateral: Secondary | ICD-10-CM | POA: Diagnosis not present

## 2018-01-02 ENCOUNTER — Ambulatory Visit: Payer: Medicare Other | Admitting: Internal Medicine

## 2018-01-02 ENCOUNTER — Encounter: Payer: Self-pay | Admitting: Internal Medicine

## 2018-01-02 VITALS — BP 116/70 | HR 66 | Ht 60.0 in | Wt 136.2 lb

## 2018-01-02 DIAGNOSIS — K219 Gastro-esophageal reflux disease without esophagitis: Secondary | ICD-10-CM | POA: Diagnosis not present

## 2018-01-02 MED ORDER — OMEPRAZOLE 40 MG PO CPDR: 40.0000 mg | DELAYED_RELEASE_CAPSULE | Freq: Every day | ORAL | 11 refills | Status: DC

## 2018-01-02 NOTE — Progress Notes (Signed)
HISTORY OF PRESENT ILLNESS:  Michelle Lane is a 72 y.o. female with GERD, esophageal stricture, and diverticulosis who presents today for follow-up regarding management of chronic GERD. The patient was last seen in this office January 2017 for several week history of significant epigastric discomfort exacerbated by meals. See that dictation. Laboratories were unrevealing. CT scan of the abdomen and pelvis suggested possible gastric wall thickening. Upper endoscopy was performed February 2017. This was normal except for esophageal stricture, hiatal hernia, and benign fundic gland polyps. She was prescribed Levsin sublingual for possible spasm and told the follow-up in 6 weeks. Though she did not follow-up in 6 weeks as requested, she did ask for refills of her Levsin. Patient tells me that she presents now after requesting a refill of her omeprazole for GERD as it has been over 2 years since she's been seen in the office. She reports the medication continues to be effective. She denies dysphagia. Her entire GI review of systems is negative upon review. She understands potential PPI related issues. She tells me that she does undergo colon cancer screening in the form of stool studies performed at her PCPs office annually. According to the patient, these have been negative. No family history of colon cancer as previously thought (she clarifies it as prostate cancer)  REVIEW OF SYSTEMS:  All non-GI ROS negative unless otherwise stated in the history of present illness  Past Medical History:  Diagnosis Date  . Asthma   . Diaphragmatic hernia without mention of obstruction or gangrene   . Diverticulosis   . Esophageal reflux   . Esophageal stricture   . Gastric polyps    benign    Past Surgical History:  Procedure Laterality Date  . APPENDECTOMY    . CHOLECYSTECTOMY    . LEFT OOPHORECTOMY    . TUBAL LIGATION      Social History Michelle PolioKaren R Vigeant  reports that she has quit smoking. She has never  used smokeless tobacco. She reports that she drinks alcohol. She reports that she does not use drugs.  family history includes Breast cancer in her maternal grandmother and mother; Diabetes in her brother; Heart disease in her brother; Kidney disease in her brother; Prostate cancer in her father.  Allergies  Allergen Reactions  . Other     Multiple food allergies       PHYSICAL EXAMINATION: Vital signs: BP 116/70   Pulse 66   Ht 5' (1.524 m)   Wt 136 lb 4 oz (61.8 kg)   BMI 26.61 kg/m   Constitutional: generally well-appearing, no acute distress Psychiatric: alert and oriented x3, cooperative Eyes: extraocular movements intact, anicteric, conjunctiva pink Mouth: oral pharynx moist, no lesions Neck: supple no lymphadenopathy Cardiovascular: heart regular rate and rhythm, no murmur Lungs: clear to auscultation bilaterally Abdomen: soft, nontender, nondistended, no obvious ascites, no peritoneal signs, normal bowel sounds, no organomegaly Rectal:mitted Extremities: no clubbing, cyanosis, or lower extremity edema bilaterally Skin: no lesions on visible extremities Neuro: No focal deficits. Cranial nerves intact  ASSESSMENT:  #1. Chronic GERD. Managed with once daily PPI #2. Asymptomatic esophageal stricture #3. Known diverticulosis on colonoscopy 2002   PLAN:  #1. Reflux precautions. Reviewed #2. Continue omeprazole 40 mg daily. Prescription refilled with multiple refills #3. Continue colon cancer screening strategies with her PCP #4. Routine GI office follow-up 2 years. Sooner if clinically indicated. Patient agrees

## 2018-01-02 NOTE — Patient Instructions (Signed)
We have sent the following medications to your pharmacy for you to pick up at your convenience:  Omeprazole  Please follow up in 2 years  

## 2018-04-13 ENCOUNTER — Other Ambulatory Visit: Payer: Self-pay | Admitting: Internal Medicine

## 2018-04-13 DIAGNOSIS — Z1231 Encounter for screening mammogram for malignant neoplasm of breast: Secondary | ICD-10-CM

## 2018-04-25 DIAGNOSIS — Z79899 Other long term (current) drug therapy: Secondary | ICD-10-CM | POA: Diagnosis not present

## 2018-04-25 DIAGNOSIS — E7849 Other hyperlipidemia: Secondary | ICD-10-CM | POA: Diagnosis not present

## 2018-04-25 DIAGNOSIS — R82998 Other abnormal findings in urine: Secondary | ICD-10-CM | POA: Diagnosis not present

## 2018-04-25 DIAGNOSIS — Z23 Encounter for immunization: Secondary | ICD-10-CM | POA: Diagnosis not present

## 2018-05-07 DIAGNOSIS — E7849 Other hyperlipidemia: Secondary | ICD-10-CM | POA: Diagnosis not present

## 2018-05-07 DIAGNOSIS — G629 Polyneuropathy, unspecified: Secondary | ICD-10-CM | POA: Diagnosis not present

## 2018-05-07 DIAGNOSIS — Z87448 Personal history of other diseases of urinary system: Secondary | ICD-10-CM | POA: Diagnosis not present

## 2018-05-07 DIAGNOSIS — Z0000 Encounter for general adult medical examination without abnormal findings: Secondary | ICD-10-CM | POA: Diagnosis not present

## 2018-05-08 DIAGNOSIS — Z1212 Encounter for screening for malignant neoplasm of rectum: Secondary | ICD-10-CM | POA: Diagnosis not present

## 2018-05-23 ENCOUNTER — Ambulatory Visit: Payer: Medicare Other

## 2018-06-26 ENCOUNTER — Ambulatory Visit
Admission: RE | Admit: 2018-06-26 | Discharge: 2018-06-26 | Disposition: A | Discharge status: Home / Self Care | Payer: Medicare Other | Source: Ambulatory Visit | Attending: Internal Medicine | Admitting: Internal Medicine

## 2018-06-26 DIAGNOSIS — Z1231 Encounter for screening mammogram for malignant neoplasm of breast: Secondary | ICD-10-CM

## 2018-07-02 DIAGNOSIS — M25511 Pain in right shoulder: Secondary | ICD-10-CM | POA: Diagnosis not present

## 2018-07-02 DIAGNOSIS — M722 Plantar fascial fibromatosis: Secondary | ICD-10-CM | POA: Diagnosis not present

## 2018-07-10 DIAGNOSIS — M25511 Pain in right shoulder: Secondary | ICD-10-CM | POA: Diagnosis not present

## 2018-07-10 DIAGNOSIS — M722 Plantar fascial fibromatosis: Secondary | ICD-10-CM | POA: Diagnosis not present

## 2018-07-30 DIAGNOSIS — M722 Plantar fascial fibromatosis: Secondary | ICD-10-CM | POA: Diagnosis not present

## 2018-07-30 DIAGNOSIS — M25511 Pain in right shoulder: Secondary | ICD-10-CM | POA: Diagnosis not present

## 2018-07-31 DIAGNOSIS — M79671 Pain in right foot: Secondary | ICD-10-CM | POA: Diagnosis not present

## 2018-07-31 DIAGNOSIS — M79672 Pain in left foot: Secondary | ICD-10-CM | POA: Diagnosis not present

## 2018-12-20 DIAGNOSIS — R300 Dysuria: Secondary | ICD-10-CM | POA: Diagnosis not present

## 2018-12-20 DIAGNOSIS — N390 Urinary tract infection, site not specified: Secondary | ICD-10-CM | POA: Diagnosis not present

## 2018-12-25 DIAGNOSIS — H25813 Combined forms of age-related cataract, bilateral: Secondary | ICD-10-CM | POA: Diagnosis not present

## 2018-12-25 DIAGNOSIS — H52201 Unspecified astigmatism, right eye: Secondary | ICD-10-CM | POA: Diagnosis not present

## 2018-12-25 DIAGNOSIS — H524 Presbyopia: Secondary | ICD-10-CM | POA: Diagnosis not present

## 2019-01-06 ENCOUNTER — Other Ambulatory Visit: Payer: Self-pay | Admitting: Internal Medicine

## 2019-01-09 DIAGNOSIS — J45901 Unspecified asthma with (acute) exacerbation: Secondary | ICD-10-CM | POA: Diagnosis not present

## 2019-01-11 ENCOUNTER — Telehealth: Payer: Self-pay | Admitting: Internal Medicine

## 2019-01-11 MED ORDER — OMEPRAZOLE 40 MG PO CPDR: DELAYED_RELEASE_CAPSULE | ORAL | 3 refills | Status: DC

## 2019-01-11 NOTE — Telephone Encounter (Signed)
Patient is calling get a refill for omeprazole (PRILOSEC) 40 MG capsule - Call into [facility] on [address]

## 2019-01-11 NOTE — Telephone Encounter (Signed)
Omeprazole refilled.

## 2019-01-11 NOTE — Telephone Encounter (Signed)
Refilled Omeprazole 

## 2019-01-17 ENCOUNTER — Other Ambulatory Visit: Payer: Self-pay | Admitting: Internal Medicine

## 2019-01-17 DIAGNOSIS — Z1231 Encounter for screening mammogram for malignant neoplasm of breast: Secondary | ICD-10-CM

## 2019-04-27 DIAGNOSIS — Z23 Encounter for immunization: Secondary | ICD-10-CM | POA: Diagnosis not present

## 2019-05-09 DIAGNOSIS — Z1212 Encounter for screening for malignant neoplasm of rectum: Secondary | ICD-10-CM | POA: Diagnosis not present

## 2019-05-09 DIAGNOSIS — Z79899 Other long term (current) drug therapy: Secondary | ICD-10-CM | POA: Diagnosis not present

## 2019-05-09 DIAGNOSIS — E7849 Other hyperlipidemia: Secondary | ICD-10-CM | POA: Diagnosis not present

## 2019-05-10 DIAGNOSIS — R82998 Other abnormal findings in urine: Secondary | ICD-10-CM | POA: Diagnosis not present

## 2019-05-22 DIAGNOSIS — K219 Gastro-esophageal reflux disease without esophagitis: Secondary | ICD-10-CM | POA: Diagnosis not present

## 2019-05-22 DIAGNOSIS — Z0000 Encounter for general adult medical examination without abnormal findings: Secondary | ICD-10-CM | POA: Diagnosis not present

## 2019-05-22 DIAGNOSIS — J45909 Unspecified asthma, uncomplicated: Secondary | ICD-10-CM | POA: Diagnosis not present

## 2019-05-22 DIAGNOSIS — E785 Hyperlipidemia, unspecified: Secondary | ICD-10-CM | POA: Diagnosis not present

## 2019-07-01 ENCOUNTER — Other Ambulatory Visit: Payer: Self-pay

## 2019-07-01 ENCOUNTER — Ambulatory Visit
Admission: RE | Admit: 2019-07-01 | Discharge: 2019-07-01 | Disposition: A | Discharge status: Home / Self Care | Payer: Medicare Other | Source: Ambulatory Visit | Attending: Internal Medicine | Admitting: Internal Medicine

## 2019-07-01 DIAGNOSIS — Z1231 Encounter for screening mammogram for malignant neoplasm of breast: Secondary | ICD-10-CM | POA: Diagnosis not present

## 2019-09-01 ENCOUNTER — Ambulatory Visit: Payer: Medicare Other | Attending: Internal Medicine

## 2019-09-01 DIAGNOSIS — Z23 Encounter for immunization: Secondary | ICD-10-CM

## 2019-09-01 NOTE — Progress Notes (Signed)
   Covid-19 Vaccination Clinic  Name:  ANABIA WEATHERWAX    MRN: [id number] DOB: 1945/08/04  09/01/2019  Ms. Finken was observed post Covid-19 immunization for 15 minutes without incidence. She was provided with Vaccine Information Sheet and instruction to access the V-Safe system.   Ms. Berber was instructed to call 911 with any severe reactions post vaccine: Marland Kitchen Difficulty breathing  . Swelling of your face and throat  . A fast heartbeat  . A bad rash all over your body  . Dizziness and weakness    Immunizations Administered    Name Date Dose VIS Date Route   Pfizer COVID-19 Vaccine 09/01/2019 12:13 PM 0.3 mL 07/05/2019 Intramuscular   Manufacturer: [employer], [employer]   Lot: [id number]   NDC: [address]-2

## 2019-09-17 ENCOUNTER — Ambulatory Visit: Payer: Medicare Other

## 2019-09-25 ENCOUNTER — Ambulatory Visit: Payer: Medicare Other | Attending: Internal Medicine

## 2019-09-25 DIAGNOSIS — Z23 Encounter for immunization: Secondary | ICD-10-CM

## 2019-09-25 NOTE — Progress Notes (Signed)
   Covid-19 Vaccination Clinic  Name:  LATOYIA TECSON    MRN: [id number] DOB: 1946-04-09  09/25/2019  Ms. Alanis was observed post Covid-19 immunization for 15 minutes without incident. She was provided with Vaccine Information Sheet and instruction to access the V-Safe system.   Ms. Cassell was instructed to call 911 with any severe reactions post vaccine: Marland Kitchen Difficulty breathing  . Swelling of face and throat  . A fast heartbeat  . A bad rash all over body  . Dizziness and weakness   Immunizations Administered    Name Date Dose VIS Date Route   Pfizer COVID-19 Vaccine 09/25/2019 11:17 AM 0.3 mL 07/05/2019 Intramuscular   Manufacturer: [employer], [employer]   Lot: [id number]   NDC: [address]-7

## 2019-12-26 DIAGNOSIS — H25813 Combined forms of age-related cataract, bilateral: Secondary | ICD-10-CM | POA: Diagnosis not present

## 2019-12-26 DIAGNOSIS — H52203 Unspecified astigmatism, bilateral: Secondary | ICD-10-CM | POA: Diagnosis not present

## 2019-12-26 DIAGNOSIS — H524 Presbyopia: Secondary | ICD-10-CM | POA: Diagnosis not present

## 2019-12-30 ENCOUNTER — Other Ambulatory Visit: Payer: Self-pay

## 2019-12-30 MED ORDER — OMEPRAZOLE 40 MG PO CPDR: DELAYED_RELEASE_CAPSULE | ORAL | 0 refills | Status: AC

## 2020-03-14 ENCOUNTER — Other Ambulatory Visit: Payer: Self-pay | Admitting: Internal Medicine

## 2020-04-28 ENCOUNTER — Ambulatory Visit: Payer: Medicare Other | Attending: Internal Medicine

## 2020-04-28 DIAGNOSIS — Z23 Encounter for immunization: Secondary | ICD-10-CM

## 2020-04-28 NOTE — Progress Notes (Signed)
   Covid-19 Vaccination Clinic  Name:  TAZIAH DIFATTA    MRN: [id number] DOB: [birth year]  04/28/2020  Ms. Windle was observed post Covid-19 immunization for 15 minutes without incident. She was provided with Vaccine Information Sheet and instruction to access the V-Safe system.   Ms. Fedder was instructed to call 911 with any severe reactions post vaccine: Marland Kitchen Difficulty breathing  . Swelling of face and throat  . A fast heartbeat  . A bad rash all over body  . Dizziness and weakness

## 2020-05-13 DIAGNOSIS — E785 Hyperlipidemia, unspecified: Secondary | ICD-10-CM | POA: Diagnosis not present

## 2020-05-20 DIAGNOSIS — R82998 Other abnormal findings in urine: Secondary | ICD-10-CM | POA: Diagnosis not present

## 2020-05-20 DIAGNOSIS — Z0000 Encounter for general adult medical examination without abnormal findings: Secondary | ICD-10-CM | POA: Diagnosis not present

## 2020-05-20 DIAGNOSIS — E785 Hyperlipidemia, unspecified: Secondary | ICD-10-CM | POA: Diagnosis not present

## 2020-05-20 DIAGNOSIS — K219 Gastro-esophageal reflux disease without esophagitis: Secondary | ICD-10-CM | POA: Diagnosis not present

## 2020-05-20 DIAGNOSIS — G629 Polyneuropathy, unspecified: Secondary | ICD-10-CM | POA: Diagnosis not present

## 2020-06-02 ENCOUNTER — Other Ambulatory Visit: Payer: Self-pay | Admitting: Internal Medicine

## 2020-06-02 DIAGNOSIS — Z1231 Encounter for screening mammogram for malignant neoplasm of breast: Secondary | ICD-10-CM

## 2020-07-14 ENCOUNTER — Other Ambulatory Visit: Payer: Self-pay

## 2020-07-14 ENCOUNTER — Ambulatory Visit
Admission: RE | Admit: 2020-07-14 | Discharge: 2020-07-14 | Disposition: A | Discharge status: Home / Self Care | Payer: Medicare Other | Source: Ambulatory Visit | Attending: Internal Medicine | Admitting: Internal Medicine

## 2020-07-14 DIAGNOSIS — Z1231 Encounter for screening mammogram for malignant neoplasm of breast: Secondary | ICD-10-CM | POA: Diagnosis not present

## 2020-10-01 DIAGNOSIS — R350 Frequency of micturition: Secondary | ICD-10-CM | POA: Diagnosis not present

## 2020-12-28 DIAGNOSIS — H25813 Combined forms of age-related cataract, bilateral: Secondary | ICD-10-CM | POA: Diagnosis not present

## 2020-12-28 DIAGNOSIS — H524 Presbyopia: Secondary | ICD-10-CM | POA: Diagnosis not present

## 2021-02-15 ENCOUNTER — Other Ambulatory Visit (HOSPITAL_BASED_OUTPATIENT_CLINIC_OR_DEPARTMENT_OTHER): Payer: Self-pay

## 2021-02-15 ENCOUNTER — Ambulatory Visit: Payer: Medicare Other | Attending: Internal Medicine

## 2021-02-15 DIAGNOSIS — Z23 Encounter for immunization: Secondary | ICD-10-CM

## 2021-02-15 MED ORDER — PFIZER-BIONT COVID-19 VAC-TRIS 30 MCG/0.3ML IM SUSP
INTRAMUSCULAR | 0 refills | Status: AC
  Filled 2021-02-15: qty 0.3, 1d supply, fill #0

## 2021-02-15 NOTE — Progress Notes (Signed)
   Covid-19 Vaccination Clinic  Name:  Michelle Lane    MRN: [id number] DOB: 1946-05-25  02/15/2021  [name] was observed post Covid-19 immunization for 15 minutes without incident. She was provided with Vaccine Information Sheet and instruction to access the V-Safe system.   [name] was instructed to call 911 with any severe reactions post vaccine: Difficulty breathing  Swelling of face and throat  A fast heartbeat  A bad rash all over body  Dizziness and weakness   Immunizations Administered     Name Date Dose VIS Date Route   PFIZER Comrnaty(Gray TOP) Covid-19 Vaccine 02/15/2021  1:21 PM 0.3 mL 07/02/2020 Intramuscular   Manufacturer: [employer], [employer]   Lot: [id number]   NDC: [phone]

## 2021-05-15 DIAGNOSIS — Z23 Encounter for immunization: Secondary | ICD-10-CM | POA: Diagnosis not present

## 2021-05-26 DIAGNOSIS — E785 Hyperlipidemia, unspecified: Secondary | ICD-10-CM | POA: Diagnosis not present

## 2021-06-02 DIAGNOSIS — R82998 Other abnormal findings in urine: Secondary | ICD-10-CM | POA: Diagnosis not present

## 2021-06-08 DIAGNOSIS — Z1212 Encounter for screening for malignant neoplasm of rectum: Secondary | ICD-10-CM | POA: Diagnosis not present

## 2021-12-28 DIAGNOSIS — H524 Presbyopia: Secondary | ICD-10-CM | POA: Diagnosis not present

## 2021-12-28 DIAGNOSIS — H25813 Combined forms of age-related cataract, bilateral: Secondary | ICD-10-CM | POA: Diagnosis not present

## 2022-05-14 DIAGNOSIS — Z23 Encounter for immunization: Secondary | ICD-10-CM | POA: Diagnosis not present

## 2022-05-30 DIAGNOSIS — Z1212 Encounter for screening for malignant neoplasm of rectum: Secondary | ICD-10-CM | POA: Diagnosis not present

## 2022-05-30 DIAGNOSIS — E785 Hyperlipidemia, unspecified: Secondary | ICD-10-CM | POA: Diagnosis not present

## 2022-05-30 DIAGNOSIS — R7989 Other specified abnormal findings of blood chemistry: Secondary | ICD-10-CM | POA: Diagnosis not present

## 2022-06-06 DIAGNOSIS — R82998 Other abnormal findings in urine: Secondary | ICD-10-CM | POA: Diagnosis not present

## 2022-06-06 DIAGNOSIS — K219 Gastro-esophageal reflux disease without esophagitis: Secondary | ICD-10-CM | POA: Diagnosis not present

## 2022-06-06 DIAGNOSIS — E785 Hyperlipidemia, unspecified: Secondary | ICD-10-CM | POA: Diagnosis not present

## 2022-06-06 DIAGNOSIS — G629 Polyneuropathy, unspecified: Secondary | ICD-10-CM | POA: Diagnosis not present

## 2022-06-06 DIAGNOSIS — Z0000 Encounter for general adult medical examination without abnormal findings: Secondary | ICD-10-CM | POA: Diagnosis not present

## 2022-12-29 DIAGNOSIS — H524 Presbyopia: Secondary | ICD-10-CM | POA: Diagnosis not present

## 2022-12-29 DIAGNOSIS — H25813 Combined forms of age-related cataract, bilateral: Secondary | ICD-10-CM | POA: Diagnosis not present

## 2023-05-06 DIAGNOSIS — Z23 Encounter for immunization: Secondary | ICD-10-CM | POA: Diagnosis not present

## 2023-06-05 DIAGNOSIS — E785 Hyperlipidemia, unspecified: Secondary | ICD-10-CM | POA: Diagnosis not present

## 2023-06-12 DIAGNOSIS — M79601 Pain in right arm: Secondary | ICD-10-CM | POA: Diagnosis not present

## 2023-06-12 DIAGNOSIS — Z1339 Encounter for screening examination for other mental health and behavioral disorders: Secondary | ICD-10-CM | POA: Diagnosis not present

## 2023-06-12 DIAGNOSIS — Z1331 Encounter for screening for depression: Secondary | ICD-10-CM | POA: Diagnosis not present

## 2023-06-12 DIAGNOSIS — R82998 Other abnormal findings in urine: Secondary | ICD-10-CM | POA: Diagnosis not present

## 2023-06-12 DIAGNOSIS — Z0000 Encounter for general adult medical examination without abnormal findings: Secondary | ICD-10-CM | POA: Diagnosis not present

## 2024-05-11 DIAGNOSIS — Z23 Encounter for immunization: Secondary | ICD-10-CM | POA: Diagnosis not present

## 2024-06-16 DIAGNOSIS — Z1212 Encounter for screening for malignant neoplasm of rectum: Secondary | ICD-10-CM | POA: Diagnosis not present

## 2024-06-17 DIAGNOSIS — M159 Polyosteoarthritis, unspecified: Secondary | ICD-10-CM | POA: Diagnosis not present

## 2024-06-17 DIAGNOSIS — Z1339 Encounter for screening examination for other mental health and behavioral disorders: Secondary | ICD-10-CM | POA: Diagnosis not present

## 2024-06-17 DIAGNOSIS — Z1331 Encounter for screening for depression: Secondary | ICD-10-CM | POA: Diagnosis not present

## 2024-06-17 DIAGNOSIS — Z0000 Encounter for general adult medical examination without abnormal findings: Secondary | ICD-10-CM | POA: Diagnosis not present

## 2024-06-17 DIAGNOSIS — R82998 Other abnormal findings in urine: Secondary | ICD-10-CM | POA: Diagnosis not present
